# Patient Record
Sex: Male | Born: 1985
Health system: Southern US, Community
[De-identification: ages and names within clinical notes are randomized; demographics above are authoritative.]

## PROBLEM LIST (undated history)

## (undated) DIAGNOSIS — T7840XA Allergy, unspecified, initial encounter: Secondary | ICD-10-CM

## (undated) DIAGNOSIS — R55 Syncope and collapse: Secondary | ICD-10-CM

## (undated) DIAGNOSIS — Z8619 Personal history of other infectious and parasitic diseases: Secondary | ICD-10-CM

## (undated) HISTORY — DX: Allergy, unspecified, initial encounter: T78.40XA

## (undated) HISTORY — DX: Personal history of other infectious and parasitic diseases: Z86.19

## (undated) HISTORY — DX: Syncope and collapse: R55

---

## 2006-07-25 ENCOUNTER — Emergency Department (HOSPITAL_COMMUNITY): Admission: EM | Admit: 2006-07-25 | Discharge: 2006-07-25 | Payer: Self-pay | Admitting: Emergency Medicine

## 2007-04-03 ENCOUNTER — Emergency Department (HOSPITAL_COMMUNITY): Admission: EM | Admit: 2007-04-03 | Discharge: 2007-04-03 | Payer: Self-pay | Admitting: Emergency Medicine

## 2007-06-30 ENCOUNTER — Ambulatory Visit: Payer: Self-pay | Admitting: Internal Medicine

## 2007-06-30 DIAGNOSIS — L03319 Cellulitis of trunk, unspecified: Secondary | ICD-10-CM

## 2007-06-30 DIAGNOSIS — M1990 Unspecified osteoarthritis, unspecified site: Secondary | ICD-10-CM | POA: Insufficient documentation

## 2007-06-30 DIAGNOSIS — L02219 Cutaneous abscess of trunk, unspecified: Secondary | ICD-10-CM | POA: Insufficient documentation

## 2007-07-31 ENCOUNTER — Encounter: Payer: Self-pay | Admitting: Internal Medicine

## 2008-02-08 ENCOUNTER — Emergency Department (HOSPITAL_COMMUNITY): Admission: EM | Admit: 2008-02-08 | Discharge: 2008-02-08 | Payer: Self-pay | Admitting: Emergency Medicine

## 2012-12-04 ENCOUNTER — Ambulatory Visit: Payer: PRIVATE HEALTH INSURANCE

## 2012-12-04 ENCOUNTER — Ambulatory Visit (INDEPENDENT_AMBULATORY_CARE_PROVIDER_SITE_OTHER): Payer: PRIVATE HEALTH INSURANCE | Admitting: Emergency Medicine

## 2012-12-04 VITALS — BP 110/70 | HR 72 | Temp 98.1°F | Resp 16 | Ht 69.0 in | Wt 154.0 lb

## 2012-12-04 DIAGNOSIS — R059 Cough, unspecified: Secondary | ICD-10-CM

## 2012-12-04 DIAGNOSIS — J209 Acute bronchitis, unspecified: Secondary | ICD-10-CM

## 2012-12-04 DIAGNOSIS — R05 Cough: Secondary | ICD-10-CM

## 2012-12-04 LAB — POCT CBC
Granulocyte percent: 70.4 %G (ref 37–80)
HCT, POC: 49.1 % (ref 43.5–53.7)
Hemoglobin: 16.5 g/dL (ref 14.1–18.1)
MCH, POC: 32 pg — AB (ref 27–31.2)
MCV: 95.3 fL (ref 80–97)
Platelet Count, POC: 205 10*3/uL (ref 142–424)
RBC: 5.15 M/uL (ref 4.69–6.13)
WBC: 9.9 10*3/uL (ref 4.6–10.2)

## 2012-12-04 MED ORDER — AZITHROMYCIN 250 MG PO TABS: ORAL_TABLET | ORAL | Status: DC

## 2012-12-04 MED ORDER — BENZONATATE 100 MG PO CAPS: 100.0000 mg | ORAL_CAPSULE | Freq: Three times a day (TID) | ORAL | Status: DC | PRN

## 2012-12-04 NOTE — Progress Notes (Signed)
This chart was scribed for Earl Lites, MD by Joaquin Music, ED Scribe. This patient was seen in room Room 11 and the patient's care was started at 11:00 AM  Subjective:    Patient ID: Nicholas Hammond, male    DOB: Jan 25, 1986, 27 y.o.   MRN: [id number]  Sore Throat  Associated symptoms include coughing, headaches and shortness of breath.  Headache  Associated symptoms include coughing.  Cough Associated symptoms include chills, headaches and shortness of breath.   Nicholas Hammond is a 27 y.o. male who presents to the [facility] complaining of ongoing worsening chills, cough, SOB, sore throat, muscle aches, and possible fever onset 4 days. Pt denies taking temp at home. Pt states he has pain with deep breaths. Pt states his sputum has been green & yellow. 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 He states many of his co-workers are getting sick.  Pt requested a note to be excused from work due to current illness.   Review of Systems  Constitutional: Positive for chills.  Respiratory: Positive for cough and shortness of breath.   Neurological: Positive for headaches.       Objective:   Physical Exam  HENT:  Head: Normocephalic.  Right Ear: External ear normal.  Left Ear: External ear normal.  Mouth/Throat: Oropharynx is clear and moist.  Nose congested. Throat normal.  Cardiovascular: Normal rate, regular rhythm and normal heart sounds.   Pulmonary/Chest:  Rhonchi L anterior chest.  [facility] reading (PRIMARY) by  Dr. Cleta Alberts there are increased markings in the right middle lobe no consolidative infiltrate  Results for orders placed in visit on 12/04/12  POCT CBC      Result Value Range   WBC 9.9  4.6 - 10.2 K/uL   Lymph, poc 2.3  0.6 - 3.4   POC LYMPH PERCENT 23.0  10 - 50 %L   MID (cbc) 0.7  0 - 0.9   POC MID % 6.6  0 - 12 %M   POC Granulocyte 7.0 (*) 2 - 6.9   Granulocyte percent 70.4  37 - 80 %G   RBC 5.15  4.69 - 6.13 M/uL   Hemoglobin 16.5  14.1 - 18.1 g/dL   HCT,  POC 78.2  95.6 - 53.7 %   MCV 95.3  80 - 97 fL   MCH, POC 32.0 (*) 27 - 31.2 pg   MCHC 33.6  31.8 - 35.4 g/dL   RDW, POC 21.3     Platelet Count, POC 205  142 - 424 K/uL   MPV 10.1  0 - 99.8 fL       Assessment & Plan:  We'll treat with Z-Pak ,Tessalon Perles out of work note until Monday

## 2012-12-04 NOTE — Patient Instructions (Signed)
Smoking Cessation Quitting smoking is important to your health and has many advantages. However, it is not always easy to quit since nicotine is a very addictive drug. Often times, people try 3 times or more before being able to quit. This document explains the best ways for you to prepare to quit smoking. Quitting takes hard work and a lot of effort, but you can do it. ADVANTAGES OF QUITTING SMOKING  You will live longer, feel better, and live better.  Your body will feel the impact of quitting smoking almost immediately.  Within 20 minutes, blood pressure decreases. Your pulse returns to its normal level.  After 8 hours, carbon monoxide levels in the blood return to normal. Your oxygen level increases.  After 24 hours, the chance of having a heart attack starts to decrease. Your breath, hair, and body stop smelling like smoke.  After 48 hours, damaged nerve endings begin to recover. Your sense of taste and smell improve.  After 72 hours, the body is virtually free of nicotine. Your bronchial tubes relax and breathing becomes easier.  After 2 to 12 weeks, lungs can hold more air. Exercise becomes easier and circulation improves.  The risk of having a heart attack, stroke, cancer, or lung disease is greatly reduced.  After 1 year, the risk of coronary heart disease is cut in half.  After 5 years, the risk of stroke falls to the same as a nonsmoker.  After 10 years, the risk of lung cancer is cut in half and the risk of other cancers decreases significantly.  After 15 years, the risk of coronary heart disease drops, usually to the level of a nonsmoker.  If you are pregnant, quitting smoking will improve your chances of having a healthy baby.  The people you live with, especially any children, will be healthier.  You will have extra money to spend on things other than cigarettes. QUESTIONS TO THINK ABOUT BEFORE ATTEMPTING TO QUIT You may want to talk about your answers with your  caregiver.  Why do you want to quit?  If you tried to quit in the past, what helped and what did not?  What will be the most difficult situations for you after you quit? How will you plan to handle them?  Who can help you through the tough times? Your family? Friends? A caregiver?  What pleasures do you get from smoking? What ways can you still get pleasure if you quit? Here are some questions to ask your caregiver:  How can you help me to be successful at quitting?  What medicine do you think would be best for me and how should I take it?  What should I do if I need more help?  What is smoking withdrawal like? How can I get information on withdrawal? GET READY  Set a quit date.  Change your environment by getting rid of all cigarettes, ashtrays, matches, and lighters in your home, car, or work. Do not let people smoke in your home.  Review your past attempts to quit. Think about what worked and what did not. GET SUPPORT AND ENCOURAGEMENT You have a better chance of being successful if you have help. You can get support in many ways.  Tell your family, friends, and co-workers that you are going to quit and need their support. Ask them not to smoke around you.  Get individual, group, or telephone counseling and support. Programs are available at local hospitals and health centers. Call your local health department for   information about programs in your area.  Spiritual beliefs and practices may help some smokers quit.  Download a "quit meter" on your computer to keep track of quit statistics, such as how long you have gone without smoking, cigarettes not smoked, and money saved.  Get a self-help book about quitting smoking and staying off of tobacco. LEARN NEW SKILLS AND BEHAVIORS  Distract yourself from urges to smoke. Talk to someone, go for a walk, or occupy your time with a task.  Change your normal routine. Take a different route to work. Drink tea instead of coffee.  Eat breakfast in a different place.  Reduce your stress. Take a hot bath, exercise, or read a book.  Plan something enjoyable to do every day. Reward yourself for not smoking.  Explore interactive web-based programs that specialize in helping you quit. GET MEDICINE AND USE IT CORRECTLY Medicines can help you stop smoking and decrease the urge to smoke. Combining medicine with the above behavioral methods and support can greatly increase your chances of successfully quitting smoking.  Nicotine replacement therapy helps deliver nicotine to your body without the negative effects and risks of smoking. Nicotine replacement therapy includes nicotine gum, lozenges, inhalers, nasal sprays, and skin patches. Some may be available over-the-counter and others require a prescription.  Antidepressant medicine helps people abstain from smoking, but how this works is unknown. This medicine is available by prescription.  Nicotinic receptor partial agonist medicine simulates the effect of nicotine in your brain. This medicine is available by prescription. Ask your caregiver for advice about which medicines to use and how to use them based on your health history. Your caregiver will tell you what side effects to look out for if you choose to be on a medicine or therapy. Carefully read the information on the package. Do not use any other product containing nicotine while using a nicotine replacement product.  RELAPSE OR DIFFICULT SITUATIONS Most relapses occur within the first 3 months after quitting. Do not be discouraged if you start smoking again. Remember, most people try several times before finally quitting. You may have symptoms of withdrawal because your body is used to nicotine. You may crave cigarettes, be irritable, feel very hungry, cough often, get headaches, or have difficulty concentrating. The withdrawal symptoms are only temporary. They are strongest when you first quit, but they will go away within  10 14 days. To reduce the chances of relapse, try to:  Avoid drinking alcohol. Drinking lowers your chances of successfully quitting.  Reduce the amount of caffeine you consume. Once you quit smoking, the amount of caffeine in your body increases and can give you symptoms, such as a rapid heartbeat, sweating, and anxiety.  Avoid smokers because they can make you want to smoke.  Do not let weight gain distract you. Many smokers will gain weight when they quit, usually less than 10 pounds. Eat a healthy diet and stay active. You can always lose the weight gained after you quit.  Find ways to improve your mood other than smoking. FOR MORE INFORMATION  www.smokefree.gov  Document Released: 01/29/2001 Document Revised: 08/06/2011 Document Reviewed: 05/16/2011 Exit[employer] Patient Information 2014 [employer].  

## 2016-04-30 DIAGNOSIS — L2389 Allergic contact dermatitis due to other agents: Secondary | ICD-10-CM | POA: Diagnosis not present

## 2016-09-12 DIAGNOSIS — M25562 Pain in left knee: Secondary | ICD-10-CM | POA: Diagnosis not present

## 2016-09-20 DIAGNOSIS — M25562 Pain in left knee: Secondary | ICD-10-CM | POA: Diagnosis not present

## 2016-09-20 DIAGNOSIS — M238X2 Other internal derangements of left knee: Secondary | ICD-10-CM | POA: Diagnosis not present

## 2016-09-26 DIAGNOSIS — M25562 Pain in left knee: Secondary | ICD-10-CM | POA: Diagnosis not present

## 2016-10-04 DIAGNOSIS — M2392 Unspecified internal derangement of left knee: Secondary | ICD-10-CM | POA: Diagnosis not present

## 2016-10-04 DIAGNOSIS — M94262 Chondromalacia, left knee: Secondary | ICD-10-CM | POA: Diagnosis not present

## 2016-10-04 DIAGNOSIS — M25562 Pain in left knee: Secondary | ICD-10-CM | POA: Diagnosis not present

## 2017-04-01 ENCOUNTER — Ambulatory Visit (INDEPENDENT_AMBULATORY_CARE_PROVIDER_SITE_OTHER): Payer: 59 | Admitting: Family Medicine

## 2017-04-01 ENCOUNTER — Encounter: Payer: Self-pay | Admitting: Family Medicine

## 2017-04-01 VITALS — BP 110/80 | HR 73 | Temp 98.8°F | Ht 68.5 in | Wt 169.5 lb

## 2017-04-01 DIAGNOSIS — K529 Noninfective gastroenteritis and colitis, unspecified: Secondary | ICD-10-CM

## 2017-04-01 MED ORDER — DICYCLOMINE HCL 10 MG PO CAPS: 10.0000 mg | ORAL_CAPSULE | Freq: Three times a day (TID) | ORAL | 1 refills | Status: DC

## 2017-04-01 NOTE — Progress Notes (Signed)
Subjective:  Nicholas BoroughsJonathan K Hammond is a 32 y.o. male who presents today with a chief complaint of frequent stools and to establish care.   HPI:  Frequent Stools, new problem Symptoms started a couple weeks ago.  He was at a [employer] party where there were a lot of fluids late out of the table.  He is not sure if any of these foods are bad.  Over the last couple weeks, has had frequent stools.  Sometimes has as many as 6 bowel movements within 4 hours pain.  Bowel movements are usually well formed and solid.  No hematochezia.  No melena.  No watery or loose stools.  Occasionally has abdominal pain and a crampy sensation in his lower abdomen that resolves with bowel movements.  No nausea or vomiting.  No fevers or chills.  No night sweats.  No weight loss.  No recent medication changes.  No other obvious alleviating or aggravating factors.  ROS: Per HPI, otherwise a 10 point review of systems was performed and was negative  PMH:  The following were reviewed and entered/updated in epic: Past Medical History:  Diagnosis Date  . Allergy   . History of chicken pox    Patient Active Problem List   Diagnosis Date Noted  . Frequent stools 04/01/2017  . ABSCESS, TRUNK 06/30/2007  . DEGENERATIVE JOINT DISEASE, BACK 06/30/2007   History reviewed. No pertinent surgical history.  Family History  Problem Relation Age of Onset  . Crohn's disease Mother   . Arthritis Sister   . Heart disease Sister        takayasu's arteritis  . Rheum arthritis Sister   . Crohn's disease Sister   . Crohn's disease Brother   . Cancer Maternal Grandmother   . Heart disease Maternal Grandmother   . Heart disease Maternal Grandfather   . Heart disease Paternal Grandfather     Medications- reviewed and updated Current Outpatient Medications  Medication Sig Dispense Refill  . dicyclomine (BENTYL) 10 MG capsule Take 1 capsule (10 mg total) by mouth 4 (four) times daily -  before meals and at bedtime. 120  capsule 1   No current facility-administered medications for this visit.     Allergies-reviewed and updated Allergies  Allergen Reactions  . Influenza Vaccines Other (See Comments)    Seizures   Social History   Socioeconomic History  . Marital status: Single    Spouse name: None  . Number of children: None  . Years of education: None  . Highest education level: None  Social Needs  . Financial resource strain: None  . Food insecurity - worry: None  . Food insecurity - inability: None  . Transportation needs - medical: None  . Transportation needs - non-medical: None  Occupational History  . None  Tobacco Use  . Smoking status: Current Every Day Smoker    Packs/day: 1.00    Years: 15.00    Pack years: 15.00    Types: Cigarettes  . Smokeless tobacco: Never Used  . Tobacco comment: No longer smoking cigarettes / VAPE 100 ml's a month  Substance and Sexual Activity  . Alcohol use: Yes    Comment: socially  . Drug use: Yes    Types: Marijuana    Comment: 1/4 tsp everyday  . Sexual activity: Yes  Other Topics Concern  . None  Social History Narrative  . None     Objective:  Physical Exam: BP 110/80 (BP Location: Left Arm, Patient Position: Sitting,  Cuff Size: Normal)   Pulse 73   Temp 98.8 F (37.1 C) (Oral)   Ht 5' 8.5" (1.74 m)   Wt 169 lb 8 oz (76.9 kg)   SpO2 96%   BMI 25.40 kg/m   Gen: NAD, resting comfortably CV: RRR with no murmurs appreciated Pulm: NWOB, CTAB with no crackles, wheezes, or rhonchi GI: Normal bowel sounds present. Soft, Nontender, Nondistended. MSK: No edema, cyanosis, or clubbing noted Skin: Warm, dry Neuro: Grossly normal, moves all extremities Psych: Normal affect and thought content  Assessment/Plan:  Frequent stools Unclear etiology.  May be irritable bowel syndrome.  Also consider food borne illness, however less likely given his lack of diarrhea, nausea/vomiting, and abdominal pain.  Will check stool panel to rule out  infectious causes.  We will also empirically start Bentyl to help with spasms.  He does not have any red flag signs or symptoms.  His abdominal exam is benign.  If symptoms persist and do not improve with Bentyl, would consider referral to GI for further evaluation.  Katina Degree. Jimmey Ralph, MD 04/01/2017 4:50 PM

## 2017-04-01 NOTE — Patient Instructions (Signed)
Start the bentyl.  We will check a stool sample.  Come back to see me soon.  Take care, Dr Jimmey RalphParker

## 2017-04-01 NOTE — Assessment & Plan Note (Signed)
Unclear etiology.  May be irritable bowel syndrome.  Also consider food borne illness, however less likely given his lack of diarrhea, nausea/vomiting, and abdominal pain.  Will check stool panel to rule out infectious causes.  We will also empirically start Bentyl to help with spasms.  He does not have any red flag signs or symptoms.  His abdominal exam is benign.  If symptoms persist and do not improve with Bentyl, would consider referral to GI for further evaluation.

## 2017-04-04 ENCOUNTER — Ambulatory Visit (INDEPENDENT_AMBULATORY_CARE_PROVIDER_SITE_OTHER): Payer: 59 | Admitting: Family Medicine

## 2017-04-04 ENCOUNTER — Encounter: Payer: Self-pay | Admitting: Family Medicine

## 2017-04-04 VITALS — BP 118/72 | HR 78 | Temp 98.5°F | Ht 68.5 in | Wt 167.0 lb

## 2017-04-04 DIAGNOSIS — F17200 Nicotine dependence, unspecified, uncomplicated: Secondary | ICD-10-CM | POA: Diagnosis not present

## 2017-04-04 DIAGNOSIS — Z114 Encounter for screening for human immunodeficiency virus [HIV]: Secondary | ICD-10-CM

## 2017-04-04 DIAGNOSIS — Z13220 Encounter for screening for lipoid disorders: Secondary | ICD-10-CM

## 2017-04-04 DIAGNOSIS — K529 Noninfective gastroenteritis and colitis, unspecified: Secondary | ICD-10-CM

## 2017-04-04 DIAGNOSIS — Z0001 Encounter for general adult medical examination with abnormal findings: Secondary | ICD-10-CM

## 2017-04-04 LAB — LIPID PANEL
Cholesterol: 191 mg/dL (ref 0–200)
HDL: 45.4 mg/dL (ref >39.00)
LDL Cholesterol: 129 mg/dL — ABNORMAL HIGH (ref 0–99)
NONHDL: 145.68
Total CHOL/HDL Ratio: 4
Triglycerides: 82 mg/dL (ref 0.0–149.0)
VLDL: 16.4 mg/dL (ref 0.0–40.0)

## 2017-04-04 LAB — CBC
HEMATOCRIT: 47.2 % (ref 39.0–52.0)
Hemoglobin: 16.3 g/dL (ref 13.0–17.0)
MCHC: 34.6 g/dL (ref 30.0–36.0)
MCV: 89.6 fl (ref 78.0–100.0)
Platelets: 241 10*3/uL (ref 150.0–400.0)
RBC: 5.26 Mil/uL (ref 4.22–5.81)
RDW: 13.3 % (ref 11.5–15.5)
WBC: 5.9 10*3/uL (ref 4.0–10.5)

## 2017-04-04 LAB — COMPREHENSIVE METABOLIC PANEL
ALT: 15 U/L (ref 0–53)
AST: 17 U/L (ref 0–37)
Albumin: 5.2 g/dL (ref 3.5–5.2)
Alkaline Phosphatase: 65 U/L (ref 39–117)
BUN: 13 mg/dL (ref 6–23)
CHLORIDE: 101 meq/L (ref 96–112)
CO2: 31 mEq/L (ref 19–32)
Calcium: 10.1 mg/dL (ref 8.4–10.5)
Creatinine, Ser: 1.01 mg/dL (ref 0.40–1.50)
GFR: 90.39 mL/min (ref >60.00)
GLUCOSE: 89 mg/dL (ref 70–99)
POTASSIUM: 3.9 meq/L (ref 3.5–5.1)
SODIUM: 143 meq/L (ref 135–145)
Total Bilirubin: 1 mg/dL (ref 0.2–1.2)
Total Protein: 7.6 g/dL (ref 6.0–8.3)

## 2017-04-04 NOTE — Progress Notes (Signed)
Subjective:  Nicholas BoroughsJonathan K Hammond is a 32 y.o. male who presents today for his annual comprehensive physical exam.    HPI:  He has no acute complaints today.   Lifestyle Diet: Cut out a lot of sweets. No specific diets.  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   Depression screen PHQ 2/9 04/01/2017  Decreased Interest 0  Down, Depressed, Hopeless 0  PHQ - 2 Score 0   Health Maintenance Due  Topic Date Due  . HIV Screening  03/16/1999    ROS: Per HPI, otherwise a 10 point review of systems was performed and was negative  PMH:  The following were reviewed and entered/updated in epic: Past Medical History:  Diagnosis Date  . Allergy   . History of chicken pox    Patient Active Problem List   Diagnosis Date Noted  . Nicotine dependence with current use 04/04/2017  . Frequent stools 04/01/2017  . ABSCESS, TRUNK 06/30/2007  . DEGENERATIVE JOINT DISEASE, BACK 06/30/2007   History reviewed. No pertinent surgical history.  Family History  Problem Relation Age of Onset  . Crohn's disease Mother   . Arthritis Sister   . Heart disease Sister        takayasu's arteritis  . Rheum arthritis Sister   . Crohn's disease Sister   . Crohn's disease Brother   . Cancer Maternal Grandmother   . Heart disease Maternal Grandmother   . Heart disease Maternal Grandfather   . Heart disease Paternal Grandfather     Medications- reviewed and updated Current Outpatient Medications  Medication Sig Dispense Refill  . dicyclomine (BENTYL) 10 MG capsule Take 1 capsule (10 mg total) by mouth 4 (four) times daily -  before meals and at bedtime. (Patient not taking: Reported on 04/04/2017) 120 capsule 1   No current facility-administered medications for this visit.     Allergies-reviewed and updated Allergies  Allergen Reactions  . Influenza Vaccines Other (See Comments)    Seizures    Social History   Socioeconomic History  . Marital status: Single    Spouse name: None    . Number of children: None  . Years of education: None  . Highest education level: None  Social Needs  . Financial resource strain: None  . Food insecurity - worry: None  . Food insecurity - inability: None  . Transportation needs - medical: None  . Transportation needs - non-medical: None  Occupational History  . None  Tobacco Use  . Smoking status: Current Every Day Smoker    Packs/day: 1.00    Years: 15.00    Pack years: 15.00    Types: Cigarettes  . Smokeless tobacco: Never Used  . Tobacco comment: No longer smoking cigarettes / VAPE 100 ml's a month  Substance and Sexual Activity  . Alcohol use: Yes    Comment: socially  . Drug use: Yes    Types: Marijuana    Comment: 1/4 tsp everyday  . Sexual activity: Yes  Other Topics Concern  . None  Social History Narrative  . None    Objective:  Physical Exam: BP 118/72 (BP Location: Left Arm, Patient Position: Sitting, Cuff Size: Normal)   Pulse 78   Temp 98.5 F (36.9 C) (Oral)   Ht 5' 8.5" (1.74 m)   Wt 167 lb (75.8 kg)   SpO2 98%   BMI 25.02 kg/m   Body mass index is 25.02 kg/m. Wt Readings from Last 3 Encounters:  04/04/17 167 lb (75.8 kg)  04/01/17 169 lb 8 oz (76.9 kg)  12/04/12 154 lb (69.9 kg)   Gen: NAD, resting comfortably HEENT: TMs normal bilaterally. OP clear. No thyromegaly noted.  CV: RRR with no murmurs appreciated Pulm: NWOB, CTAB with no crackles, wheezes, or rhonchi GI: Normal bowel sounds present. Soft, Nontender, Nondistended. MSK: no edema, cyanosis, or clubbing noted Skin: warm, dry Neuro: CN2-12 grossly intact. Strength 5/5 in upper and lower extremities. Reflexes symmetric and intact bilaterally.  Psych: Normal affect and thought content  Assessment/Plan:  Nicotine dependence with current use Patient was asked about his nicotine use today and was strongly advised to quit. Patient is currently vaping and actively trying to cut bacl. We reviewed treatment options to assist him quit  smoking including NRT, Chantix, and Bupropion. Follow up at next office visit.   Total time spent counseling approximately 3 minutes.    Preventative Healthcare: Check lipid panel and HIV antibody.  Patient Counseling:  -Nutrition: Stressed importance of moderation in sodium/caffeine intake, saturated fat and cholesterol, caloric balance, sufficient intake of fresh fruits, vegetables, and fiber.  -Stressed the importance of regular exercise.   -Substance Abuse: Discussed cessation/primary prevention of tobacco, alcohol, or other drug use; driving or other dangerous activities under the influence; availability of treatment for abuse.   -Injury prevention: Discussed safety belts, safety helmets, smoke detector, smoking near bedding or upholstery.   -Sexuality: Discussed sexually transmitted diseases, partner selection, use of condoms, avoidance of unintended pregnancy and contraceptive alternatives.   -Dental health: Discussed importance of regular tooth brushing, flossing, and dental visits.  -Health maintenance and immunizations reviewed. Please refer to Health maintenance section.  Return to care in 1 year for next preventative visit.   Katina Degree. Jimmey Ralph, MD 04/04/2017 10:55 AM

## 2017-04-04 NOTE — Patient Instructions (Signed)
Preventive Care 18-39 Years, Male Preventive care refers to lifestyle choices and visits with your health care provider that can promote health and wellness. What does preventive care include?  A yearly physical exam. This is also called an annual well check.  Dental exams once or twice a year.  Routine eye exams. Ask your health care provider how often you should have your eyes checked.  Personal lifestyle choices, including: ? Daily care of your teeth and gums. ? Regular physical activity. ? Eating a healthy diet. ? Avoiding tobacco and drug use. ? Limiting alcohol use. ? Practicing safe sex. What happens during an annual well check? The services and screenings done by your health care provider during your annual well check will depend on your age, overall health, lifestyle risk factors, and family history of disease. Counseling Your health care provider may ask you questions about your:  Alcohol use.  Tobacco use.  Drug use.  Emotional well-being.  Home and relationship well-being.  Sexual activity.  Eating habits.  Work and work Statistician.  Screening You may have the following tests or measurements:  Height, weight, and BMI.  Blood pressure.  Lipid and cholesterol levels. These may be checked every 5 years starting at age 72.  Diabetes screening. This is done by checking your blood sugar (glucose) after you have not eaten for a while (fasting).  Skin check.  Hepatitis C blood test.  Hepatitis B blood test.  Sexually transmitted disease (STD) testing.  Discuss your test results, treatment options, and if necessary, the need for more tests with your health care provider. Vaccines Your health care provider may recommend certain vaccines, such as:  Influenza vaccine. This is recommended every year.  Tetanus, diphtheria, and acellular pertussis (Tdap, Td) vaccine. You may need a Td booster every 10 years.  Varicella vaccine. You may need this if you  have not been vaccinated.  HPV vaccine. If you are 77 or younger, you may need three doses over 6 months.  Measles, mumps, and rubella (MMR) vaccine. You may need at least one dose of MMR.You may also need a second dose.  Pneumococcal 13-valent conjugate (PCV13) vaccine. You may need this if you have certain conditions and have not been vaccinated.  Pneumococcal polysaccharide (PPSV23) vaccine. You may need one or two doses if you smoke cigarettes or if you have certain conditions.  Meningococcal vaccine. One dose is recommended if you are age 55-21 years and a first-year college student living in a residence hall, or if you have one of several medical conditions. You may also need additional booster doses.  Hepatitis A vaccine. You may need this if you have certain conditions or if you travel or work in places where you may be exposed to hepatitis A.  Hepatitis B vaccine. You may need this if you have certain conditions or if you travel or work in places where you may be exposed to hepatitis B.  Haemophilus influenzae type b (Hib) vaccine. You may need this if you have certain risk factors.  Talk to your health care provider about which screenings and vaccines you need and how often you need them. This information is not intended to replace advice given to you by your health care provider. Make sure you discuss any questions you have with your health care provider. Document Released: 04/02/2001 Document Revised: 10/25/2015 Document Reviewed: 12/06/2014 Elsevier Interactive Patient Education  [employer].

## 2017-04-04 NOTE — Assessment & Plan Note (Signed)
Patient was asked about his nicotine use today and was strongly advised to quit. Patient is currently vaping and actively trying to cut bacl. We reviewed treatment options to assist him quit smoking including NRT, Chantix, and Bupropion. Follow up at next office visit.   Total time spent counseling approximately 3 minutes.

## 2017-04-05 LAB — HIV ANTIBODY (ROUTINE TESTING W REFLEX): HIV 1&2 Ab, 4th Generation: NONREACTIVE

## 2017-04-07 LAB — GASTROINTESTINAL PATHOGEN PANEL PCR
C. DIFFICILE TOX A/B, PCR: NOT DETECTED
CRYPTOSPORIDIUM, PCR: NOT DETECTED
Campylobacter, PCR: NOT DETECTED
E COLI (STEC) STX1/STX2, PCR: NOT DETECTED
E COLI 0157, PCR: NOT DETECTED
E coli (ETEC) LT/ST PCR: NOT DETECTED
Giardia lamblia, PCR: NOT DETECTED
Norovirus, PCR: NOT DETECTED
ROTAVIRUS, PCR: NOT DETECTED
SALMONELLA, PCR: NOT DETECTED
Shigella, PCR: NOT DETECTED

## 2017-04-14 ENCOUNTER — Telehealth: Payer: Self-pay | Admitting: Family Medicine

## 2017-04-14 NOTE — Telephone Encounter (Signed)
Copied from CRM [id number]. Topic: Quick Communication - Lab Results >> Apr 08, 2017  2:53 PM Agner, Ricki MillerAmber M, CMA wrote: Called patient to inform them of lab results. When patient returns call, triage nurse may disclose results.   04/14/17   Pt returning call about lab work please call back at [phone]

## 2017-04-14 NOTE — Telephone Encounter (Signed)
Pt given results per notes of Dr. Jimmey RalphParker on 04/08/17, patient verbalized understanding. He says his stools are better and back to normal.  Unable to document in result note due to result note not being in PEC basket.

## 2017-04-15 NOTE — Telephone Encounter (Signed)
Noted  

## 2018-01-31 DIAGNOSIS — H60391 Other infective otitis externa, right ear: Secondary | ICD-10-CM | POA: Diagnosis not present

## 2018-01-31 DIAGNOSIS — J069 Acute upper respiratory infection, unspecified: Secondary | ICD-10-CM | POA: Diagnosis not present

## 2018-01-31 DIAGNOSIS — J00 Acute nasopharyngitis [common cold]: Secondary | ICD-10-CM | POA: Diagnosis not present

## 2019-03-05 DIAGNOSIS — R0782 Intercostal pain: Secondary | ICD-10-CM | POA: Diagnosis not present

## 2019-03-05 DIAGNOSIS — Z0001 Encounter for general adult medical examination with abnormal findings: Secondary | ICD-10-CM | POA: Diagnosis not present

## 2019-03-09 DIAGNOSIS — R0782 Intercostal pain: Secondary | ICD-10-CM | POA: Diagnosis not present

## 2019-03-09 DIAGNOSIS — Z0001 Encounter for general adult medical examination with abnormal findings: Secondary | ICD-10-CM | POA: Diagnosis not present

## 2019-05-27 ENCOUNTER — Ambulatory Visit: Payer: Self-pay

## 2019-05-27 ENCOUNTER — Ambulatory Visit: Payer: Self-pay | Attending: Internal Medicine

## 2019-05-27 DIAGNOSIS — Z23 Encounter for immunization: Secondary | ICD-10-CM

## 2019-05-27 NOTE — Progress Notes (Signed)
   Covid-19 Vaccination Clinic  Name:  [name]    MRN: [id number] DOB: [birth year]  05/27/2019  [name] was observed post Covid-19 immunization for 15 minutes without incident. He was provided with Vaccine Information Sheet and instruction to access the V-Safe system.   [name] was instructed to call 911 with any severe reactions post vaccine: Marland Kitchen Difficulty breathing  . Swelling of face and throat  . A fast heartbeat  . A bad rash all over body  . Dizziness and weakness   Immunizations Administered    Name Date Dose VIS Date Route   Moderna COVID-19 Vaccine 05/27/2019 10:06 AM 0.5 mL 01/19/2019 Intramuscular   Manufacturer: Moderna   Lot: [id number]   NDC: 36144-315-40

## 2019-06-29 ENCOUNTER — Ambulatory Visit: Payer: Self-pay | Attending: Family

## 2019-06-29 DIAGNOSIS — Z23 Encounter for immunization: Secondary | ICD-10-CM

## 2019-06-29 NOTE — Progress Notes (Signed)
   Covid-19 Vaccination Clinic  Name:  Nicholas Hammond    MRN: [id number] DOB: 1985/12/03  06/29/2019  [name] was observed post Covid-19 immunization for 15 minutes without incident. He was provided with Vaccine Information Sheet and instruction to access the V-Safe system.   [name] was instructed to call 911 with any severe reactions post vaccine: Marland Kitchen Difficulty breathing  . Swelling of face and throat  . A fast heartbeat  . A bad rash all over body  . Dizziness and weakness   Immunizations Administered    Name Date Dose VIS Date Route   Moderna COVID-19 Vaccine 06/29/2019 10:00 AM 0.5 mL 01/2019 Intramuscular   Manufacturer: Moderna   Lot: [id number]   NDC: 94446-190-12

## 2019-07-26 DIAGNOSIS — M545 Low back pain: Secondary | ICD-10-CM | POA: Diagnosis not present

## 2019-07-26 DIAGNOSIS — M461 Sacroiliitis, not elsewhere classified: Secondary | ICD-10-CM | POA: Diagnosis not present

## 2019-08-02 DIAGNOSIS — M545 Low back pain: Secondary | ICD-10-CM | POA: Diagnosis not present

## 2019-09-20 DIAGNOSIS — Z23 Encounter for immunization: Secondary | ICD-10-CM | POA: Diagnosis not present

## 2019-11-01 DIAGNOSIS — H60509 Unspecified acute noninfective otitis externa, unspecified ear: Secondary | ICD-10-CM | POA: Diagnosis not present

## 2019-12-15 DIAGNOSIS — M546 Pain in thoracic spine: Secondary | ICD-10-CM | POA: Diagnosis not present

## 2019-12-15 DIAGNOSIS — M545 Low back pain, unspecified: Secondary | ICD-10-CM | POA: Diagnosis not present

## 2020-04-19 DIAGNOSIS — K089 Disorder of teeth and supporting structures, unspecified: Secondary | ICD-10-CM | POA: Diagnosis not present

## 2020-04-19 DIAGNOSIS — K122 Cellulitis and abscess of mouth: Secondary | ICD-10-CM | POA: Diagnosis not present

## 2020-07-09 ENCOUNTER — Emergency Department (HOSPITAL_COMMUNITY): Payer: BC Managed Care – PPO

## 2020-07-09 ENCOUNTER — Other Ambulatory Visit: Payer: Self-pay

## 2020-07-09 ENCOUNTER — Emergency Department (HOSPITAL_COMMUNITY)
Admission: EM | Admit: 2020-07-09 | Discharge: 2020-07-10 | Disposition: A | Discharge status: Home / Self Care | Payer: BC Managed Care – PPO | Attending: Emergency Medicine | Admitting: Emergency Medicine

## 2020-07-09 ENCOUNTER — Encounter (HOSPITAL_COMMUNITY): Payer: Self-pay

## 2020-07-09 DIAGNOSIS — E876 Hypokalemia: Secondary | ICD-10-CM | POA: Diagnosis not present

## 2020-07-09 DIAGNOSIS — F17210 Nicotine dependence, cigarettes, uncomplicated: Secondary | ICD-10-CM | POA: Insufficient documentation

## 2020-07-09 DIAGNOSIS — R4020 Unspecified coma: Secondary | ICD-10-CM

## 2020-07-09 DIAGNOSIS — G40909 Epilepsy, unspecified, not intractable, without status epilepticus: Secondary | ICD-10-CM | POA: Insufficient documentation

## 2020-07-09 DIAGNOSIS — R079 Chest pain, unspecified: Secondary | ICD-10-CM | POA: Diagnosis not present

## 2020-07-09 DIAGNOSIS — R569 Unspecified convulsions: Secondary | ICD-10-CM

## 2020-07-09 DIAGNOSIS — R55 Syncope and collapse: Secondary | ICD-10-CM | POA: Insufficient documentation

## 2020-07-09 DIAGNOSIS — R0789 Other chest pain: Secondary | ICD-10-CM | POA: Diagnosis not present

## 2020-07-09 DIAGNOSIS — R001 Bradycardia, unspecified: Secondary | ICD-10-CM | POA: Diagnosis not present

## 2020-07-09 LAB — BASIC METABOLIC PANEL
Anion gap: 10 (ref 5–15)
BUN: 12 mg/dL (ref 6–20)
CO2: 28 mmol/L (ref 22–32)
Calcium: 9.1 mg/dL (ref 8.9–10.3)
Chloride: 103 mmol/L (ref 98–111)
Creatinine, Ser: 0.97 mg/dL (ref 0.61–1.24)
GFR, Estimated: >60 mL/min (ref >60)
Glucose, Bld: 113 mg/dL — ABNORMAL HIGH (ref 70–99)
Potassium: 2.9 mmol/L — ABNORMAL LOW (ref 3.5–5.1)
Sodium: 141 mmol/L (ref 135–145)

## 2020-07-09 LAB — CBC WITH DIFFERENTIAL/PLATELET
Abs Immature Granulocytes: 0.05 10*3/uL (ref 0.00–0.07)
Basophils Absolute: 0.1 10*3/uL (ref 0.0–0.1)
Basophils Relative: 1 %
Eosinophils Absolute: 0.2 10*3/uL (ref 0.0–0.5)
Eosinophils Relative: 2 %
HCT: 41.5 % (ref 39.0–52.0)
Hemoglobin: 14.6 g/dL (ref 13.0–17.0)
Immature Granulocytes: 0 %
Lymphocytes Relative: 40 %
Lymphs Abs: 5.6 10*3/uL — ABNORMAL HIGH (ref 0.7–4.0)
MCH: 31.2 pg (ref 26.0–34.0)
MCHC: 35.2 g/dL (ref 30.0–36.0)
MCV: 88.7 fL (ref 80.0–100.0)
Monocytes Absolute: 0.7 10*3/uL (ref 0.1–1.0)
Monocytes Relative: 5 %
Neutro Abs: 7.4 10*3/uL (ref 1.7–7.7)
Neutrophils Relative %: 52 %
Platelets: 311 10*3/uL (ref 150–400)
RBC: 4.68 MIL/uL (ref 4.22–5.81)
RDW: 12.8 % (ref 11.5–15.5)
WBC: 14.1 10*3/uL — ABNORMAL HIGH (ref 4.0–10.5)
nRBC: 0 % (ref 0.0–0.2)

## 2020-07-09 LAB — TROPONIN I (HIGH SENSITIVITY): Troponin I (High Sensitivity): 3 ng/L (ref <18)

## 2020-07-09 MED ORDER — SODIUM CHLORIDE 0.9 % IV BOLUS
1000.0000 mL | Freq: Once | INTRAVENOUS | Status: AC
  Administered 2020-07-10: 1000 mL via INTRAVENOUS

## 2020-07-09 MED ORDER — POTASSIUM CHLORIDE CRYS ER 20 MEQ PO TBCR
40.0000 meq | EXTENDED_RELEASE_TABLET | Freq: Once | ORAL | Status: AC
  Administered 2020-07-10: 40 meq via ORAL
  Filled 2020-07-09 (×2): qty 2

## 2020-07-09 NOTE — ED Provider Notes (Incomplete)
[facility] EMERGENCY DEPARTMENT Provider Note   CSN: [id number] Arrival date & time: 07/09/20  2115     History Chief Complaint  Patient presents with  . Near Syncope    Nicholas Hammond is a 35 y.o. male with a hx of tobacco use who presents to the ED   HPI     Past Medical History:  Diagnosis Date  . Allergy   . History of chicken pox     Patient Active Problem List   Diagnosis Date Noted  . Nicotine dependence with current use 04/04/2017  . Frequent stools 04/01/2017  . ABSCESS, TRUNK 06/30/2007  . DEGENERATIVE JOINT DISEASE, BACK 06/30/2007    History reviewed. No pertinent surgical history.     Family History  Problem Relation Age of Onset  . Crohn's disease Mother   . Arthritis Sister   . Heart disease Sister        takayasu's arteritis  . Rheum arthritis Sister   . Crohn's disease Sister   . Crohn's disease Brother   . Cancer Maternal Grandmother   . Heart disease Maternal Grandmother   . Heart disease Maternal Grandfather   . Heart disease Paternal Grandfather     Social History   Tobacco Use  . Smoking status: Current Every Day Smoker    Packs/day: 1.00    Years: 15.00    Pack years: 15.00    Types: Cigarettes  . Smokeless tobacco: Never Used  . Tobacco comment: No longer smoking cigarettes / VAPE 100 ml's a month  Vaping Use  . Vaping Use: Never used  Substance Use Topics  . Alcohol use: Yes    Comment: socially  . Drug use: Yes    Types: Marijuana    Comment: 1/4 tsp everyday    Home Medications Prior to Admission medications   Medication Sig Start Date End Date Taking? Authorizing Provider  dicyclomine (BENTYL) 10 MG capsule Take 1 capsule (10 mg total) by mouth 4 (four) times daily -  before meals and at bedtime. Patient not taking: Reported on 04/04/2017 04/01/17   Ardith Dark, MD    Allergies    Influenza vaccines  Review of Systems   Review of Systems  Physical Exam Updated Vital Signs BP  114/81   Pulse 79   Temp 97.7 F (36.5 C) (Oral)   Resp 16   Ht 5\' 8"  (1.727 m)   Wt 70.8 kg   SpO2 100%   BMI 23.72 kg/m   Physical Exam  ED Results / Procedures / Treatments   Labs (all labs ordered are listed, but only abnormal results are displayed) Labs Reviewed  CBC WITH DIFFERENTIAL/PLATELET - Abnormal; Notable for the following components:      Result Value   WBC 14.1 (*)    Lymphs Abs 5.6 (*)    All other components within normal limits  BASIC METABOLIC PANEL - Abnormal; Notable for the following components:   Potassium 2.9 (*)    Glucose, Bld 113 (*)    All other components within normal limits  URINALYSIS, ROUTINE W REFLEX MICROSCOPIC  RAPID URINE DRUG SCREEN, HOSP PERFORMED  TROPONIN I (HIGH SENSITIVITY)  TROPONIN I (HIGH SENSITIVITY)    EKG EKG Interpretation  Date/Time:  Sunday Jul 09 2020 21:25:46 EDT Ventricular Rate:  94 PR Interval:  210 QRS Duration: 94 QT Interval:  410 QTC Calculation: 512 R Axis:   92 Text Interpretation: Sinus rhythm with 1st degree A-V block Possible Left atrial enlargement  Rightward axis Incomplete right bundle branch block T wave abnormality, consider inferolateral ischemia Prolonged QT Abnormal ECG No significant change was found Confirmed by Dione Booze ([id number]) on 07/09/2020 11:21:50 PM   Radiology DG Chest 2 View  Result Date: 07/09/2020 CLINICAL DATA:  Chest pain.  Syncope.   diaphoretic and incontinent. EXAM: CHEST - 2 VIEW COMPARISON:  Chest x-ray 12/04/2012 FINDINGS: The heart size and mediastinal contours are within normal limits. No focal consolidation. No pulmonary edema. No pleural effusion. No pneumothorax. No acute osseous abnormality. IMPRESSION: No active cardiopulmonary disease. Electronically Signed   By: Tish Frederickson M.D.   On: 07/09/2020 22:34    Procedures Procedures {Remember to document critical care time when appropriate:1}  Medications Ordered in ED Medications - No data to display  ED  Course  I have reviewed the triage vital signs and the nursing notes.  Pertinent labs & imaging results that were available during my care of the patient were reviewed by me and considered in my medical decision making (see chart for details).    MDM Rules/Calculators/A&P                          *** Final Clinical Impression(s) / ED Diagnoses Final diagnoses:  None    Rx / DC Orders ED Discharge Orders    None

## 2020-07-09 NOTE — ED Triage Notes (Signed)
Brought in by [facility] - pt got up from couch and walked to the kitchen and then syncopal episode. Pt was pale, diaphoretic and incontinent.   Pt also resports  cp when it happened. Initial HR was 40s and went to 80s. G.18 left ac.   intial bp 100/70 and came up to 150/90.

## 2020-07-09 NOTE — ED Provider Notes (Signed)
Emergency Medicine Provider Triage Evaluation Note  Nicholas Hammond , a 35 y.o. male  was evaluated in triage.  Pt complains of syncopal episode. Patient states he turned quickly to throw a koozie on the counter and develop severe, sharp, central, non-radiating chest pain. Patient thought he pulled a muscle so started doing exercises and then he began getting acutely nauseous, diaphoretic, lightheaded, and short of breath. He then woke up on the floor. He had an episode of urinary incontinence. 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 Admits to marijuana use, but no other drugs. Vapes. His brother just had an MI who is in his 73s. No history of PE/DVT or recent long immobilizations. He just recently had all of his upper teeth extracted. No lower extremity edema.   Review of Systems  Positive: Chest pain. Syncope Negative: fever  Physical Exam  BP 114/81   Pulse 79   Temp 97.7 F (36.5 C) (Oral)   Resp 16   Ht 5\' 8"  (1.727 m)   Wt 70.8 kg   SpO2 100%   BMI 23.72 kg/m  Gen:   Awake, no distress   Resp:  Normal effort  MSK:   Moves extremities without difficulty  Other:    Medical Decision Making  Medically screening exam initiated at 9:32 PM.  Appropriate orders placed.  DORIN STOOKSBURY was informed that the remainder of the evaluation will be completed by another provider, this initial triage assessment does not replace that evaluation, and the importance of remaining in the ED until their evaluation is complete.  Labs ordered. No evidence of DVT on exam.    [name] 07/09/20 2138    2139, MD 07/10/20 2350

## 2020-07-10 ENCOUNTER — Emergency Department (HOSPITAL_COMMUNITY): Payer: BC Managed Care – PPO

## 2020-07-10 DIAGNOSIS — R569 Unspecified convulsions: Secondary | ICD-10-CM | POA: Diagnosis not present

## 2020-07-10 DIAGNOSIS — R0789 Other chest pain: Secondary | ICD-10-CM | POA: Diagnosis not present

## 2020-07-10 DIAGNOSIS — E876 Hypokalemia: Secondary | ICD-10-CM | POA: Diagnosis not present

## 2020-07-10 DIAGNOSIS — R55 Syncope and collapse: Secondary | ICD-10-CM | POA: Diagnosis not present

## 2020-07-10 LAB — URINALYSIS, ROUTINE W REFLEX MICROSCOPIC
Bilirubin Urine: NEGATIVE
Glucose, UA: NEGATIVE mg/dL
Hgb urine dipstick: NEGATIVE
Ketones, ur: NEGATIVE mg/dL
Leukocytes,Ua: NEGATIVE
Nitrite: NEGATIVE
Protein, ur: NEGATIVE mg/dL
Specific Gravity, Urine: 1.008 (ref 1.005–1.030)
pH: 6 (ref 5.0–8.0)

## 2020-07-10 LAB — RAPID URINE DRUG SCREEN, HOSP PERFORMED
Amphetamines: NOT DETECTED
Barbiturates: NOT DETECTED
Benzodiazepines: NOT DETECTED
Cocaine: NOT DETECTED
Opiates: NOT DETECTED
Tetrahydrocannabinol: POSITIVE — AB

## 2020-07-10 LAB — D-DIMER, QUANTITATIVE: D-Dimer, Quant: <0.27 ug/mL-FEU (ref 0.00–0.50)

## 2020-07-10 LAB — TROPONIN I (HIGH SENSITIVITY): Troponin I (High Sensitivity): 2 ng/L (ref <18)

## 2020-07-10 LAB — MAGNESIUM: Magnesium: 2.1 mg/dL (ref 1.7–2.4)

## 2020-07-10 MED ORDER — LIDOCAINE 5 % EX PTCH
1.0000 | MEDICATED_PATCH | CUTANEOUS | Status: DC
  Administered 2020-07-10: 1 via TRANSDERMAL
  Filled 2020-07-10: qty 1

## 2020-07-10 MED ORDER — LIDOCAINE 5 % EX PTCH: 1.0000 | MEDICATED_PATCH | Freq: Every day | CUTANEOUS | 0 refills | Status: DC | PRN

## 2020-07-10 MED ORDER — POTASSIUM CHLORIDE 10 MEQ/100ML IV SOLN
10.0000 meq | Freq: Once | INTRAVENOUS | Status: AC
  Administered 2020-07-10: 10 meq via INTRAVENOUS
  Filled 2020-07-10: qty 100

## 2020-07-10 MED ORDER — POTASSIUM CHLORIDE CRYS ER 20 MEQ PO TBCR: 20.0000 meq | EXTENDED_RELEASE_TABLET | Freq: Every day | ORAL | 0 refills | Status: DC

## 2020-07-10 MED ORDER — LORAZEPAM 2 MG/ML IJ SOLN
0.5000 mg | Freq: Once | INTRAMUSCULAR | Status: AC
  Administered 2020-07-10: 0.5 mg via INTRAVENOUS
  Filled 2020-07-10: qty 1

## 2020-07-10 NOTE — ED Provider Notes (Signed)
[facility] EMERGENCY DEPARTMENT Provider Note   CSN: [id number] Arrival date & time: 07/09/20  2115     History Chief Complaint  Patient presents with  . Near Syncope    Nicholas Hammond is a 35 y.o. male with a hx of tobacco use who presents to the ED with complaints of syncope vs. Seizure that occurred shortly PTA. Patient states he turned quickly to throw a coozie onto the counter with quick onset pain to the right chest like he pulled a muscle. He was trying to stretch/move to help with this and sat back down. When he got up again and was walking into the kitchen he got lightheaded, nauseated, lowered his head between his knees and lost consciousness. His wife states she found him on the ground on his back, she thought he was having a seizure as his eyes were fluttering and he had some jerking muscle movements throughout, but not persistent clonic activity. He had an episode of urinary incontinence with this, did not bite his tongue. He was a little confused when he woke up but this passed fairly quickly. Had a headache. Currently feels nauseous and his chest feels sore. Chest discomfort is worse with certain movements/positions.  Denies fever, cough, dyspnea, numbness, weakness, visual disturbance, vomiting, or abdominal pain. He has a childhood hx of seizures from age [age]- was on phenobarbital, thinks it was vaccine related.   HPI     Past Medical History:  Diagnosis Date  . Allergy   . History of chicken pox     Patient Active Problem List   Diagnosis Date Noted  . Nicotine dependence with current use 04/04/2017  . Frequent stools 04/01/2017  . ABSCESS, TRUNK 06/30/2007  . DEGENERATIVE JOINT DISEASE, BACK 06/30/2007    History reviewed. No pertinent surgical history.     Family History  Problem Relation Age of Onset  . Crohn's disease Mother   . Arthritis Sister   . Heart disease Sister        takayasu's arteritis  . Rheum arthritis Sister   .  Crohn's disease Sister   . Crohn's disease Brother   . Cancer Maternal Grandmother   . Heart disease Maternal Grandmother   . Heart disease Maternal Grandfather   . Heart disease Paternal Grandfather     Social History   Tobacco Use  . Smoking status: Current Every Day Smoker    Packs/day: 1.00    Years: 15.00    Pack years: 15.00    Types: Cigarettes  . Smokeless tobacco: Never Used  . Tobacco comment: No longer smoking cigarettes / VAPE 100 ml's a month  Vaping Use  . Vaping Use: Never used  Substance Use Topics  . Alcohol use: Yes    Comment: socially  . Drug use: Yes    Types: Marijuana    Comment: 1/4 tsp everyday    Home Medications Prior to Admission medications   Medication Sig Start Date End Date Taking? Authorizing Provider  dicyclomine (BENTYL) 10 MG capsule Take 1 capsule (10 mg total) by mouth 4 (four) times daily -  before meals and at bedtime. Patient not taking: Reported on 04/04/2017 04/01/17   Ardith Dark, MD    Allergies    Influenza vaccines  Review of Systems   Review of Systems  Constitutional: Negative for chills and fever.  Eyes: Negative for visual disturbance.  Respiratory: Negative for shortness of breath.   Cardiovascular: Positive for chest pain.  Gastrointestinal: Positive for  nausea. Negative for abdominal pain, blood in stool, constipation, diarrhea and vomiting.  Musculoskeletal: Negative for back pain.  Neurological: Positive for light-headedness. Negative for speech difficulty, weakness and numbness.       Positive for syncope vs seizure.   All other systems reviewed and are negative.   Physical Exam Updated Vital Signs BP 123/66   Pulse 91   Temp 97.7 F (36.5 C) (Oral)   Resp 12   Ht 5\' 8"  (1.727 m)   Wt 70.8 kg   SpO2 95%   BMI 23.72 kg/m   Physical Exam Vitals and nursing note reviewed.  Constitutional:      General: He is not in acute distress.    Appearance: He is well-developed. He is not toxic-appearing.   HENT:     Head: Normocephalic and atraumatic. No raccoon eyes or Battle's sign.     Mouth/Throat:     Comments: Uvula midline. No obvious intra-oral injury/tongue abrasion.  Eyes:     General:        Right eye: No discharge.        Left eye: No discharge.     Extraocular Movements: Extraocular movements intact.     Conjunctiva/sclera: Conjunctivae normal.     Pupils: Pupils are equal, round, and reactive to light.  Cardiovascular:     Rate and Rhythm: Normal rate and regular rhythm.     Pulses:          Radial pulses are 2+ on the right side and 2+ on the left side.  Pulmonary:     Effort: Pulmonary effort is normal. No respiratory distress.     Breath sounds: Normal breath sounds. No wheezing, rhonchi or rales.  Chest:     Chest wall: No tenderness.  Abdominal:     General: There is no distension.     Palpations: Abdomen is soft.     Tenderness: There is no abdominal tenderness. There is no guarding or rebound.  Musculoskeletal:     Cervical back: Normal range of motion and neck supple. No spinous process tenderness.     Comments: Upper/lower extremities: Actively ranging at all major joints. No focal bony tenderness.  Back: No midline tenderness.   Skin:    General: Skin is warm and dry.     Findings: No rash.  Neurological:     Mental Status: He is alert.     Comments: Clear speech. CN III-XII Grossly intact. Sensation grossly intact x 4. 5/5 symmetric grip strength & strength with plantar/dorsiflexion bilaterally. Negative pronator drift. Ambulatory. Normal finger to nose.   Psychiatric:        Behavior: Behavior normal.     ED Results / Procedures / Treatments   Labs (all labs ordered are listed, but only abnormal results are displayed) Labs Reviewed  CBC WITH DIFFERENTIAL/PLATELET - Abnormal; Notable for the following components:      Result Value   WBC 14.1 (*)    Lymphs Abs 5.6 (*)    All other components within normal limits  BASIC METABOLIC PANEL -  Abnormal; Notable for the following components:   Potassium 2.9 (*)    Glucose, Bld 113 (*)    All other components within normal limits  RAPID URINE DRUG SCREEN, HOSP PERFORMED - Abnormal; Notable for the following components:   Tetrahydrocannabinol POSITIVE (*)    All other components within normal limits  URINALYSIS, ROUTINE W REFLEX MICROSCOPIC  D-DIMER, QUANTITATIVE  MAGNESIUM  TROPONIN I (HIGH SENSITIVITY)  TROPONIN I (HIGH  SENSITIVITY)    EKG EKG Interpretation  Date/Time:  Sunday Jul 09 2020 21:25:46 EDT Ventricular Rate:  94 PR Interval:  210 QRS Duration: 94 QT Interval:  410 QTC Calculation: 512 R Axis:   92 Text Interpretation: Sinus rhythm with 1st degree A-V block Possible Left atrial enlargement Rightward axis Incomplete right bundle branch block T wave abnormality, consider inferolateral ischemia Prolonged QT Abnormal ECG No significant change was found Confirmed by Dione BoozeGlick, David ([id number]) on 07/09/2020 11:21:50 PM   Radiology DG Chest 2 View  Result Date: 07/09/2020 CLINICAL DATA:  Chest pain.  Syncope.   diaphoretic and incontinent. EXAM: CHEST - 2 VIEW COMPARISON:  Chest x-ray 12/04/2012 FINDINGS: The heart size and mediastinal contours are within normal limits. No focal consolidation. No pulmonary edema. No pleural effusion. No pneumothorax. No acute osseous abnormality. IMPRESSION: No active cardiopulmonary disease. Electronically Signed   By: Tish FredericksonMorgane  Naveau M.D.   On: 07/09/2020 22:34   CT Head Wo Contrast  Result Date: 07/10/2020 CLINICAL DATA:  Seizure EXAM: CT HEAD WITHOUT CONTRAST TECHNIQUE: Contiguous axial images were obtained from the base of the skull through the vertex without intravenous contrast. COMPARISON:  None. FINDINGS: Brain: There is no mass, hemorrhage or extra-axial collection. The size and configuration of the ventricles and extra-axial CSF spaces are normal. The brain parenchyma is normal, without acute or chronic infarction. Vascular: No  abnormal hyperdensity of the major intracranial arteries or dural venous sinuses. No intracranial atherosclerosis. Skull: The visualized skull base, calvarium and extracranial soft tissues are normal. Sinuses/Orbits: No fluid levels or advanced mucosal thickening of the visualized paranasal sinuses. No mastoid or middle ear effusion. The orbits are normal. IMPRESSION: Normal head CT. Electronically Signed   By: Deatra RobinsonKevin  Herman M.D.   On: 07/10/2020 01:58    Procedures Procedures   Medications Ordered in ED Medications  potassium chloride SA (KLOR-CON) CR tablet 40 mEq (0 mEq Oral Hold 07/10/20 0025)  sodium chloride 0.9 % bolus 1,000 mL (0 mLs Intravenous Stopped 07/10/20 0141)  LORazepam (ATIVAN) injection 0.5 mg (0.5 mg Intravenous Given 07/10/20 0027)  potassium chloride 10 mEq in 100 mL IVPB (0 mEq Intravenous Stopped 07/10/20 0141)    ED Course  I have reviewed the triage vital signs and the nursing notes.  Pertinent labs & imaging results that were available during my care of the patient were reviewed by me and considered in my medical decision making (see chart for details).    MDM Rules/Calculators/A&P                         Patient presents to the ED with complaints of seizure vs. Syncope and chest pain.  Nontoxic, vitals WNL. Ativan ordered for nausea, fluids for hydration.   Additional history obtained:  Additional history obtained from chart review, discussion with patient's wife, & nursing note review.   EKG: QTc is prolonged, overall unchanged from prior.   Lab Tests:  I Ordered, reviewed, and interpreted labs, which included:  CBC: leukocytosis felt to be nonspecific.  BMP: hypokalemia @ 2.9.  Mag: WNL Troponins: no significant elevation, flat Ddimer: WNL UA: Unremarkable.  UDS: + for tetrahydrocannabinol.    Imaging Studies ordered:  I ordered imaging studies which included CXR & Head CT, I independently reviewed, formal radiology impression shows:  CXR: No active  cardiopulmonary disease Head CT: Normal head CT.  ED Course:  In regards to patient's chest pain- overall reassuring work-up. No significant change compared to prior  ECGs, troponins are flat, I have a low suspicion for ACS, given strong family hx feel he would benefit from cardiology follow up. Low risk wells, d-dimer WNL, low suspicion for PE. Normotensive, symmetric pulses, no widened mediastinum on CXR- doubt dissection. CXR w/o pneumonia, pneumothorax, or rib fx. Likely muscular with onset when throwing something with the ipsilateral hand, and worse with certain movements- will give lidoderm patches. Overall unclear seizure vs. Syncope, mild hypokalemia orally/parenterally replaced, no other significant electrolyte derangement, heat ct reassuring. Discussed no driving/operating heavy machinery, will need to follow up with neurology.   Tolerating PO, remains without focal neuro deficits in the ED, feeling improved, appears appropriate for discharge. I discussed results, treatment plan, need for follow-up, and return precautions with the patient and his wife. Provided opportunity for questions, patient & his wife confirmed understanding and are in agreement with plan.   Findings and plan of care discussed with supervising physician Dr. Blinda Leatherwood who is in agreement.   Portions of this note were generated with Scientist, clinical (histocompatibility and immunogenetics). Dictation errors may occur despite best attempts at proofreading.  Final Clinical Impression(s) / ED Diagnoses Final diagnoses:  Loss of consciousness (HCC)  Seizure-like activity (HCC)  Chest pain, unspecified type  Hypokalemia    Rx / DC Orders ED Discharge Orders         Ordered    lidocaine (LIDODERM) 5 %  Daily PRN        07/10/20 0320    potassium chloride SA (KLOR-CON) 20 MEQ tablet  Daily        07/10/20 0320    Ambulatory referral to Neurology       Comments: An appointment is requested in approximately: 1 week   07/10/20 0323            Cherly Anderson, PA-C 07/10/20 0428    Gilda Crease, MD 07/11/20 [phone]

## 2020-07-10 NOTE — ED Notes (Signed)
E-signature pad unavailable at time of pt discharge. This RN discussed discharge materials with pt and answered all pt questions. Pt stated understanding of discharge material. ? ?

## 2020-07-10 NOTE — Discharge Instructions (Addendum)
You were seen in the ER today for chest pain and passing out vs. A seizure.   Your labs showed that your potassium is low- we replaced this in the ER and are sending you home with diet recommendations and a short course of a supplement.   We are sending you home with a lidoderm patch, apply 1 patch to your chest in area of pain once per day, remove and discard patch within 12 hours of application.   We have prescribed you new medication(s) today. Discuss the medications prescribed today with your pharmacist as they can have adverse effects and interactions with your other medicines including over the counter and prescribed medications. Seek medical evaluation if you start to experience new or abnormal symptoms after taking one of these medicines, seek care immediately if you start to experience difficulty breathing, feeling of your throat closing, facial swelling, or rash as these could be indications of a more serious allergic reaction  Given the possibility that you had a seizure DO NOT DRIVE OR OPERATE HEAVY MACHINERY until cleared by a neurologist.  Please follow up with [facility] and your primary care provider as soon as possible. We are also providing information to follow up with cardiology given your family history.   Return to the ER for new or worsening symptoms including but not limited to new or worsening pain, trouble breathing, inability to keep fluids down, fever, re-occurrence of passingout/seizure, or any other concerns.

## 2020-07-21 ENCOUNTER — Encounter: Payer: Self-pay | Admitting: Family Medicine

## 2020-07-21 ENCOUNTER — Ambulatory Visit: Payer: BC Managed Care – PPO | Admitting: Family Medicine

## 2020-07-21 ENCOUNTER — Other Ambulatory Visit: Payer: Self-pay

## 2020-07-21 VITALS — BP 122/88 | HR 85 | Temp 97.4°F | Ht 68.0 in | Wt 155.2 lb

## 2020-07-21 DIAGNOSIS — E876 Hypokalemia: Secondary | ICD-10-CM | POA: Diagnosis not present

## 2020-07-21 DIAGNOSIS — R55 Syncope and collapse: Secondary | ICD-10-CM

## 2020-07-21 NOTE — Progress Notes (Signed)
   Nicholas Hammond is a 35 y.o. male who presents today for an office visit.  He has not been seen in this office for over 3 years.   Assessment/Plan:  Loss of consciousness Thankfully has not had any recurrence of symptoms.  Likely due to dehydration and lack of adequate nutrition for the few weeks prior to his episode. His cardiovascular exam today is normal.  Had extensive work-up in the ED with which negative.  He is EKG did show some concern for AV block and atrial enlargement.  We will check an echocardiogram to further evaluate.  He has an upcoming appointment with cardiology in a couple of months.  Discussed reasons to return to care and seek emergent care.  Chest wall pain Symptoms are atypical.  Reassuring that they are nonexertional.  Likely secondary to muscular strain/inflammation related to his recent injury.  He will try taking some Flexeril to see if this helps.  Can also use over-the-counter meds as needed.  Discussed reasons to return to care.  Hypokalemia Recheck BMET today.  He has been eating foods high in potassium.    Subjective:  HPI:  Patient presented to the ED on 07/09/2020 with loss of consciousness.  He was in usual state of health quick and sudden onset of pain in the right side of his chest.  Sat down and then got up again.  Felt lightheaded, nauseated and then fell to the ground.  He lost consciousness during this episode.  This was witnessed.  Wife thought he was having a seizure due to some jerking movements.  Had extensive work-up in the lab including CT head scan and labs.  Found to have low potassium.  Rest of his work-up was essentially negative.  He was given IV potassium replacement and discharged home.  Since being home he has noticed intermittent chest pain over the last couple weeks.  Located in different parts of his chest.  Worse with certain motions.  He was able to hike recently without any exacerbation of symptoms.  Symptoms overall seem to be  improving modestly.  No recurrent syncopal episodes.  He does note that prior to syncopal episode he had went about 2 weeks only eating scrambled eggs due to some dental issues.       Objective:  Physical Exam: BP 122/88   Pulse 85   Temp (!) 97.4 F (36.3 C)   Ht 5\' 8"  (1.727 m)   Wt 155 lb 3.2 oz (70.4 kg)   SpO2 97%   BMI 23.60 kg/m   Gen: No acute distress, resting comfortably CV: Regular rate and rhythm with no murmurs appreciated Pulm: Normal work of breathing, clear to auscultation bilaterally with no crackles, wheezes, or rhonchi Neuro: Cranial nerves II through XII intact.  Finger-nose-finger testing intact bilaterally.  Strength out of 5 in upper and lower extremities.  Sensation to light touch intact throughout. Psych: Normal affect and thought content  Time Spent: 50 minutes of total time was spent on the date of the encounter performing the following actions: chart review prior to seeing the patient including recent ED visit, obtaining history, performing a medically necessary exam, counseling on the treatment plan, placing orders, and documenting in our EHR.        . Katina Degree, MD 07/21/2020 2:32 PM

## 2020-07-21 NOTE — Addendum Note (Signed)
Addended by: Cleda Mccreedy F on: 07/21/2020 02:36 PM   Modules accepted: Orders

## 2020-07-21 NOTE — Addendum Note (Signed)
Addended by: Verita Kuroda F on: 07/21/2020 02:36 PM   Modules accepted: Orders  

## 2020-07-21 NOTE — Patient Instructions (Signed)
It was very nice to see you today!  I think your episode was likely due to dehydration and lack of nutrition.  We will recheck your potassium today.  We will check an echocardiogram to make sure things okay with your heart.  Take care, Dr Jimmey Ralph  PLEASE NOTE:  If you had any lab tests please let us know if you have not heard back within a few days. You may see your results on mychart before we have a chance to review them but we will give you a call once they are reviewed by [place]. If we ordered any referrals today, please let us know if you have not heard from their office within the next week.   Please try these tips to maintain a healthy lifestyle:   Eat at least 3 REAL meals and 1-2 snacks per day.  Aim for no more than 5 hours between eating.  If you eat breakfast, please do so within one hour of getting up.    Each meal should contain half fruits/vegetables, one quarter protein, and one quarter carbs (no bigger than a computer mouse)   Cut down on sweet beverages. This includes juice, soda, and sweet tea.     Drink at least 1 glass of water with each meal and aim for at least 8 glasses per day   Exercise at least 150 minutes every week.

## 2020-07-24 ENCOUNTER — Encounter: Payer: Self-pay | Admitting: Family Medicine

## 2020-07-24 LAB — BASIC METABOLIC PANEL
BUN: 10 mg/dL (ref 6–23)
CO2: 28 mEq/L (ref 19–32)
Calcium: 10.3 mg/dL (ref 8.4–10.5)
Chloride: 100 mEq/L (ref 96–112)
Creatinine, Ser: 0.94 mg/dL (ref 0.40–1.50)
GFR: 104.4 mL/min (ref >60.00)
Glucose, Bld: 93 mg/dL (ref 70–99)
Potassium: 3.9 mEq/L (ref 3.5–5.1)
Sodium: 141 mEq/L (ref 135–145)

## 2020-07-24 NOTE — Progress Notes (Signed)
Please inform patient of the following:  Potassium levels back to normal. Do not need to do any further blood work at this time.  Nicholas Hammond. Jimmey Ralph, MD 07/24/2020 2:18 PM

## 2020-07-26 DIAGNOSIS — R55 Syncope and collapse: Secondary | ICD-10-CM | POA: Diagnosis not present

## 2020-07-26 DIAGNOSIS — R0789 Other chest pain: Secondary | ICD-10-CM | POA: Diagnosis not present

## 2020-07-26 DIAGNOSIS — R079 Chest pain, unspecified: Secondary | ICD-10-CM | POA: Diagnosis not present

## 2020-07-27 ENCOUNTER — Telehealth: Payer: Self-pay

## 2020-07-27 NOTE — Telephone Encounter (Signed)
LVM asking patient to call back to get scheduled. 

## 2020-07-27 NOTE — Telephone Encounter (Signed)
Patient is calling in asking for an update for the echocardiogram, hasnt heard about scheduling anything.

## 2020-07-27 NOTE — Telephone Encounter (Signed)
Nurse Assessment Nurse: Noe Gens, RN, Archie Patten Date/Time (Eastern Time): 07/26/2020 7:55:53 PM Confirm and document reason for call. If symptomatic, describe symptoms. ---Caller states he was bending over to turn on his gaming system and started feeling chest pain and tightness. He was tingling all over his body. Denies any other issues at this time. Does the patient have any new or worsening symptoms? ---Yes Will a triage be completed? ---Yes Related visit to physician within the last 2 weeks? ---Yes Does the PT have any chronic conditions? (i.e. diabetes, asthma, this includes High risk factors for pregnancy, etc.) ---No Is this a behavioral health or substance abuse call? ---No Guidelines Guideline Title Affirmed Question Affirmed Notes Nurse Date/Time (Eastern Time) Chest Pain [1] Chest pain lasts > 5 minutes AND [2] described as crushing, pressure-like, or heavy Tomasa Rand 07/26/2020 7:58:40 PM PLEASE NOTE: All timestamps contained within this report are represented as [place] Standard Time. CONFIDENTIALTY NOTICE: This fax transmission is intended only for the addressee. It contains information that is legally privileged, confidential or otherwise protected from use or disclosure. If you are not the intended recipient, you are strictly prohibited from reviewing, disclosing, copying using or disseminating any of this information or taking any action in reliance on or regarding this information. If you have received this fax in error, please notify us immediately by telephone so that we can arrange for its return to [place]. Phone: [phone], Toll-Free: [phone], Fax: [phone] Page: 2 of 2 Call Id: [id number] Disp. Time Lamount Cohen Time) Disposition Final User 07/26/2020 7:54:48 PM Send to Urgent Alena Bills 07/26/2020 8:07:46 PM 911 Outcome Documentation Noe Gens, RN, Archie Patten Reason: Caller reports chest pain >1minutes that felt like something heavy was on his chest.  Caller states he was able to reach EMS and can hear them arriving now 07/26/2020 8:01:28 PM Call EMS 911 Now Yes Noe Gens, RN, Archie Patten Caller Disagree/Comply Comply Caller Understands Yes PreDisposition InappropriateToAsk Care Advice Given Per Guideline CALL EMS 911 NOW: * Immediate medical attention is needed. You need to hang up and call 911 (or an ambulance). * Triager Discretion: I'll call you back in a few minutes to be sure you were able to reach them. CARE ADVICE given per Chest Pain (Adult) guideline

## 2020-07-27 NOTE — Telephone Encounter (Signed)
Please schedule appointment with the pt. ? ?Thanks!  ?

## 2020-07-28 ENCOUNTER — Ambulatory Visit: Payer: BC Managed Care – PPO | Admitting: Registered Nurse

## 2020-07-28 ENCOUNTER — Ambulatory Visit (HOSPITAL_BASED_OUTPATIENT_CLINIC_OR_DEPARTMENT_OTHER)
Admission: RE | Admit: 2020-07-28 | Discharge: 2020-07-28 | Disposition: A | Discharge status: Home / Self Care | Payer: BC Managed Care – PPO | Source: Ambulatory Visit | Attending: Registered Nurse | Admitting: Registered Nurse

## 2020-07-28 ENCOUNTER — Encounter: Payer: Self-pay | Admitting: Registered Nurse

## 2020-07-28 ENCOUNTER — Other Ambulatory Visit: Payer: Self-pay

## 2020-07-28 ENCOUNTER — Telehealth: Payer: Self-pay | Admitting: Family Medicine

## 2020-07-28 VITALS — BP 119/93 | HR 118 | Temp 98.0°F | Resp 18 | Ht 68.0 in | Wt 158.4 lb

## 2020-07-28 DIAGNOSIS — D72829 Elevated white blood cell count, unspecified: Secondary | ICD-10-CM | POA: Diagnosis not present

## 2020-07-28 DIAGNOSIS — M542 Cervicalgia: Secondary | ICD-10-CM | POA: Diagnosis not present

## 2020-07-28 DIAGNOSIS — Z8261 Family history of arthritis: Secondary | ICD-10-CM | POA: Diagnosis not present

## 2020-07-28 DIAGNOSIS — R079 Chest pain, unspecified: Secondary | ICD-10-CM

## 2020-07-28 LAB — CBC WITH DIFFERENTIAL/PLATELET
Basophils Absolute: 0.1 10*3/uL (ref 0.0–0.1)
Basophils Relative: 1.1 % (ref 0.0–3.0)
Eosinophils Absolute: 0.1 10*3/uL (ref 0.0–0.7)
Eosinophils Relative: 0.6 % (ref 0.0–5.0)
HCT: 46.5 % (ref 39.0–52.0)
Hemoglobin: 16 g/dL (ref 13.0–17.0)
Lymphocytes Relative: 27 % (ref 12.0–46.0)
Lymphs Abs: 2.1 10*3/uL (ref 0.7–4.0)
MCHC: 34.3 g/dL (ref 30.0–36.0)
MCV: 89.6 fl (ref 78.0–100.0)
Monocytes Absolute: 0.5 10*3/uL (ref 0.1–1.0)
Monocytes Relative: 6 % (ref 3.0–12.0)
Neutro Abs: 5.2 10*3/uL (ref 1.4–7.7)
Neutrophils Relative %: 65.3 % (ref 43.0–77.0)
Platelets: 227 10*3/uL (ref 150.0–400.0)
RBC: 5.19 Mil/uL (ref 4.22–5.81)
RDW: 13.6 % (ref 11.5–15.5)
WBC: 7.9 10*3/uL (ref 4.0–10.5)

## 2020-07-28 LAB — COMPREHENSIVE METABOLIC PANEL
ALT: 24 U/L (ref 0–53)
AST: 18 U/L (ref 0–37)
Albumin: 5 g/dL (ref 3.5–5.2)
Alkaline Phosphatase: 66 U/L (ref 39–117)
BUN: 11 mg/dL (ref 6–23)
CO2: 30 mEq/L (ref 19–32)
Calcium: 10.3 mg/dL (ref 8.4–10.5)
Chloride: 101 mEq/L (ref 96–112)
Creatinine, Ser: 0.93 mg/dL (ref 0.40–1.50)
GFR: 105.74 mL/min (ref >60.00)
Glucose, Bld: 102 mg/dL — ABNORMAL HIGH (ref 70–99)
Potassium: 4 mEq/L (ref 3.5–5.1)
Sodium: 141 mEq/L (ref 135–145)
Total Bilirubin: 0.5 mg/dL (ref 0.2–1.2)
Total Protein: 7.5 g/dL (ref 6.0–8.3)

## 2020-07-28 LAB — SEDIMENTATION RATE: Sed Rate: 3 mm/hr (ref 0–15)

## 2020-07-28 LAB — C-REACTIVE PROTEIN: CRP: <1 mg/dL (ref 0.5–20.0)

## 2020-07-28 NOTE — Progress Notes (Signed)
Established Patient Office Visit  Subjective:  Patient ID: Nicholas Hammond, male    DOB: Jul 11, 1985  Age: 35 y.o. MRN: [id number]  CC:  Chief Complaint  Patient presents with   Chest Pain    Patient states he was had chest pain 07/09/2020 and also passed put that day. Patient has had all kinds of test done but still trying to figure out whats the problem.    HPI Nicholas Hammond presents for chest pain  Upper right chest On and off Worse with bending and certain movements Otherwise cannot reproduce EKG has shown steady in past few weeks His PCP Dr. Jimmey Ralph is working on getting echocardiogram scheduled He does have upcoming appt with cardiology in early July  Denies palpitations, crushing chest pain, headaches, dizziness, increased fatigue, breathing difficulties  He does not note any injury  No other changes.   Past Medical History:  Diagnosis Date   Allergy    History of chicken pox     No past surgical history on file.  Family History  Problem Relation Age of Onset   Crohn's disease Mother    Arthritis Sister    Heart disease Sister        takayasu's arteritis   Rheum arthritis Sister    Crohn's disease Sister    Crohn's disease Brother    Cancer Maternal Grandmother    Heart disease Maternal Grandmother    Heart disease Maternal Grandfather    Heart disease Paternal Grandfather     Social History   Socioeconomic History   Marital status: Single    Spouse name: Not on file   Number of children: Not on file   Years of education: Not on file   Highest education level: Not on file  Occupational History   Not on file  Tobacco Use   Smoking status: Every Day    Packs/day: 1.00    Years: 15.00    Pack years: 15.00    Types: Cigarettes   Smokeless tobacco: Never   Tobacco comments:    No longer smoking cigarettes / VAPE 100 ml's a month  Vaping Use   Vaping Use: Never used  Substance and Sexual Activity   Alcohol use: Yes    Comment: socially    Drug use: Yes    Types: Marijuana    Comment: 1/4 tsp everyday   Sexual activity: Yes  Other Topics Concern   Not on file  Social History Narrative   Not on file   Social Determinants of Health   Financial Resource Strain: Not on file  Food Insecurity: Not on file  Transportation Needs: Not on file  Physical Activity: Not on file  Stress: Not on file  Social Connections: Not on file  Intimate Partner Violence: Not on file    Outpatient Medications Prior to Visit  Medication Sig Dispense Refill   cyclobenzaprine (FLEXERIL) 10 MG tablet Take 10 mg by mouth 3 (three) times daily as needed for muscle spasms.     No facility-administered medications prior to visit.    Allergies  Allergen Reactions   Influenza Vaccines Other (See Comments)    Seizures    ROS Review of Systems Per hpi otherwise negative   Objective:    Physical Exam Constitutional:      General: He is not in acute distress.    Appearance: Normal appearance. He is normal weight. He is not ill-appearing, toxic-appearing or diaphoretic.  Cardiovascular:     Rate and Rhythm: Normal rate  and regular rhythm.     Heart sounds: Normal heart sounds. Heart sounds not distant. No murmur heard. No systolic murmur is present.  No diastolic murmur is present.    No friction rub. No gallop.  Pulmonary:     Effort: Pulmonary effort is normal. No respiratory distress.     Breath sounds: Normal breath sounds. No stridor. No wheezing, rhonchi or rales.  Chest:     Chest wall: No tenderness.    Musculoskeletal:     Cervical back: Normal range of motion.  Neurological:     General: No focal deficit present.     Mental Status: He is alert and oriented to person, place, and time. Mental status is at baseline.  Psychiatric:        Mood and Affect: Mood normal.        Behavior: Behavior normal.        Thought Content: Thought content normal.        Judgment: Judgment normal.    BP (!) 119/93   Pulse (!) 118    Temp 98 F (36.7 C) (Temporal)   Resp 18   Ht 5\' 8"  (1.727 m)   Wt 158 lb 6.4 oz (71.8 kg)   SpO2 97%   BMI 24.08 kg/m  Wt Readings from Last 3 Encounters:  07/28/20 158 lb 6.4 oz (71.8 kg)  07/21/20 155 lb 3.2 oz (70.4 kg)  07/09/20 156 lb (70.8 kg)     There are no preventive care reminders to display for this patient.  There are no preventive care reminders to display for this patient.  No results found for: TSH Lab Results  Component Value Date   WBC 14.1 (H) 07/09/2020   HGB 14.6 07/09/2020   HCT 41.5 07/09/2020   MCV 88.7 07/09/2020   PLT 311 07/09/2020   Lab Results  Component Value Date   NA 141 07/21/2020   K 3.9 07/21/2020   CO2 28 07/21/2020   GLUCOSE 93 07/21/2020   BUN 10 07/21/2020   CREATININE 0.94 07/21/2020   BILITOT 1.0 04/04/2017   ALKPHOS 65 04/04/2017   AST 17 04/04/2017   ALT 15 04/04/2017   PROT 7.6 04/04/2017   ALBUMIN 5.2 04/04/2017   CALCIUM 10.3 07/21/2020   ANIONGAP 10 07/09/2020   GFR 104.40 07/21/2020   Lab Results  Component Value Date   CHOL 191 04/04/2017   Lab Results  Component Value Date   HDL 45.40 04/04/2017   Lab Results  Component Value Date   LDLCALC 129 (H) 04/04/2017   Lab Results  Component Value Date   TRIG 82.0 04/04/2017   Lab Results  Component Value Date   CHOLHDL 4 04/04/2017   No results found for: HGBA1C    Assessment & Plan:   Problem List Items Addressed This Visit   None Visit Diagnoses     Chest pain, unspecified type    -  Primary   Relevant Orders   EKG 12-Lead (Completed)       No orders of the defined types were placed in this encounter.   Follow-up: No follow-ups on file.   PLAN Review of EKG: today shows potential for atrial enlargement and ventricular hypertrophy. Cannot rule out previous risk for ischemic changes. No acute changes from previous study on 07/10/20. Plan to keep appt with cardiology, though atypical nonextertional pain without reproducible qualities  does not seem cardiac in origin to me Will collect labs Given tenderness towards neck and towards L side of  thyroid will get US thyroid to rule out abnormality.unfortunately tenderness substantial enough to limit exam.  Continue to suspect msk etiology. Repeat cxr to rule out rib fracture.  Patient encouraged to call clinic with any questions, comments, or concerns. Janeece Agee, NP

## 2020-07-28 NOTE — Patient Instructions (Addendum)
Mr. Economou -   I think it's important today to rule out causes that are not cardiac in nature - I'll let Drs. Jimmey Ralph and Cristal Deer continue to rule those out.   Labs today will investigate infectious or inflammatory/autoimmune causes. It's likely some results will be back tonight. I'll call with any acute concerns.  We can try to get imaging done for the thyroid and chest xray today - Alcario Drought is working on this right now.  I'll be in touch soon  Thank you  Rich     If you have lab work done today you will be contacted with your lab results within the next 2 weeks.  If you have not heard from [place] then please contact us. The fastest way to get your results is to register for My Chart.   IF you received an x-ray today, you will receive an invoice from [facility]. Please contact [facility] at [phone] with questions or concerns regarding your invoice.   IF you received labwork today, you will receive an invoice from [facility]. Please contact [facility] at [phone] with questions or concerns regarding your invoice.   Our billing staff will not be able to assist you with questions regarding bills from these companies.  You will be contacted with the lab results as soon as they are available. The fastest way to get your results is to activate your My Chart account. Instructions are located on the last page of this paperwork. If you have not heard from [place] regarding the results in 2 weeks, please contact this office.

## 2020-07-28 NOTE — Telephone Encounter (Signed)
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  He is not very happy about this.  Please advise.

## 2020-07-31 ENCOUNTER — Ambulatory Visit: Payer: BC Managed Care – PPO | Admitting: Neurology

## 2020-07-31 ENCOUNTER — Encounter: Payer: Self-pay | Admitting: Neurology

## 2020-07-31 VITALS — BP 148/88 | HR 88 | Ht 69.0 in | Wt 158.0 lb

## 2020-07-31 DIAGNOSIS — R55 Syncope and collapse: Secondary | ICD-10-CM | POA: Diagnosis not present

## 2020-07-31 NOTE — Progress Notes (Signed)
[facility] [address] [place]. [place] [address] [phone]       OFFICE CONSULT NOTE  Mr. Nicholas Hammond Date of Birth:  [birth year] Medical Record Number:  [id number]   Referring MD: Krystal Clark, PA-C  Reason for Referral: Syncope versus seizure  HPI: Mr. Nicholas Hammond is a 35 year old Caucasian male seen today for initial office consultation visit.  History is obtained from the patient and review of electronic medical records and ER visit.  I have reviewed pertinent imaging films in PACS.  He has no significant past medical history except tobacco abuse.  On 07/09/2020 while at home and walking from his bedroom to the kitchen he suddenly turned to the right side to throw a coozie onto the counter and noticed sudden onset of sharp pain in the right chest like a muscle pull.  He tried to stretch to see if it helps with the pain and sat down. He noticed that he felt lightheaded, nauseous and broke out into a sweat.  He lowered his head between his knees and lost consciousness.  His wife who was at home found him lying on the ground on his back with eyes rolling up and fluttering and some brief jerky muscle movements which lasted barely a minute or so.  Patient also had urinary incontinence but did not bite his tongue.  He appeared slightly disoriented and confused but that did not stay for long period he had a mild headache after that and felt nauseous.  Patient denied any other episodes of syncope or near syncope in the past.  He does have remote history of seizures between ages 69 and 47 but he blames this on being allergic to the incubated egg yolk which was used in MMR and flu shots.  He has had no other seizure episodes and was never on anticonvulsants.  Denies any significant head injury with loss of consciousness, strokes, seizures or migraines.  He scheduled to undergo an echocardiogram and see a cardiologist later this month.  He did have a CT scan of the head done on  07/10/2020 which I personally reviewed and was normal.  Lab work during the ER visit was fairly unremarkable except for low potassium of 2.9 which was replaced.  He has no complaints today.  He denies being sleep deprived abusing drugs.  He states he may have been little bit dehydrated and not eating well since he had had some dental extraction done a few weeks ago and was mostly on a soft diet prior to this episode.  ROS:   14 system review of systems is positive for loss of consciousness , syncope, incontinence, chest pain all other systems negative  PMH:  Past Medical History:  Diagnosis Date   Allergy    History of chicken pox     Social History:  Social History   Socioeconomic History   Marital status: Single    Spouse name: Not on file   Number of children: Not on file   Years of education: Not on file   Highest education level: Not on file  Occupational History   Not on file  Tobacco Use   Smoking status: Former    Packs/day: 1.00    Years: 15.00    Pack years: 15.00    Types: Cigarettes   Smokeless tobacco: Never   Tobacco comments:    No longer smoking cigarettes / VAPE 100 ml's a month  Vaping Use   Vaping Use: Every day  Substance and Sexual  Activity   Alcohol use: Yes    Comment: socially   Drug use: Yes    Types: Marijuana    Comment: 1/4 tsp everyday   Sexual activity: Yes  Other Topics Concern   Not on file  Social History Narrative   Lives with fiance   Left Handed   Drinks 4-6 cups caffeine daily   Social Determinants of Health   Financial Resource Strain: Not on file  Food Insecurity: Not on file  Transportation Needs: Not on file  Physical Activity: Not on file  Stress: Not on file  Social Connections: Not on file  Intimate Partner Violence: Not on file    Medications:   Current Outpatient Medications on File Prior to Visit  Medication Sig Dispense Refill   cyclobenzaprine (FLEXERIL) 10 MG tablet Take 10 mg by mouth 3 (three) times daily  as needed for muscle spasms.     [DISCONTINUED] dicyclomine (BENTYL) 10 MG capsule Take 1 capsule (10 mg total) by mouth 4 (four) times daily -  before meals and at bedtime. (Patient not taking: Reported on 04/04/2017) 120 capsule 1   No current facility-administered medications on file prior to visit.    Allergies:   Allergies  Allergen Reactions   Influenza Vaccines Other (See Comments)    Seizures    Physical Exam General: well developed, well nourished, young Caucasian male seated, in no evident distress Head: head normocephalic and atraumatic.   Neck: supple with no carotid or supraclavicular bruits Cardiovascular: regular rate and rhythm, no murmurs Musculoskeletal: no deformity Skin:  no rash/petichiae Vascular:  Normal pulses all extremities  Neurologic Exam Mental Status: Awake and fully alert. Oriented to place and time. Recent and remote memory intact. Attention span, concentration and fund of knowledge appropriate. Mood and affect appropriate.  Cranial Nerves: Fundoscopic exam reveals sharp disc margins. Pupils equal, briskly reactive to light. Extraocular movements full without nystagmus. Visual fields full to confrontation. Hearing intact. Facial sensation intact. Face, tongue, palate moves normally and symmetrically.  Motor: Normal bulk and tone. Normal strength in all tested extremity muscles. Sensory.: intact to touch , pinprick , position and vibratory sensation.  Coordination: Rapid alternating movements normal in all extremities. Finger-to-nose and heel-to-shin performed accurately bilaterally. Gait and Station: Arises from chair without difficulty. Stance is normal. Gait demonstrates normal stride length and balance . Able to heel, toe and tandem walk without difficulty.  Reflexes: 1+ and symmetric. Toes downgoing.       ASSESSMENT:  35 year old Caucasian male with episode of brief loss of consciousness likely convulsive syncope  rather than  a seizure.  Remote  history of childhood seizures.    PLAN: I had a long discussion with the patient regarding his episode of brief loss of consciousness which sounds like a convulsive syncope.  He however has remote history of seizures since I recommend further evaluation by checking EEG as well as MRI scan of the brain.  He was also encouraged to continue his upcoming appointment with the cardiology for cardiac work-up.  He was advised not to drive for 6 months as per [facility].  He will return for follow-up in the future in 3 months or call earlier if necessary.  Greater than 50% time during this 45-minute consultation visit was spent on counseling and coordination of care about his episode of syncope and brief loss of consciousness and answering questions  Antony Contras, MD Note: This document was prepared with digital dictation and possible smart phrase technology. Any transcriptional  errors that result from this process are unintentional.

## 2020-07-31 NOTE — Patient Instructions (Signed)
I had a long discussion with the patient regarding his episode of brief loss of consciousness which sounds like a convulsive syncope.  He however has remote history of seizures since I recommend further evaluation by checking EEG as well as MRI scan of the brain.  He was also encouraged to continue his upcoming appointment with the cardiology for cardiac work-up.  He was advised not to drive for 6 months as per [facility].  He will return for follow-up in the future in 3 months or call earlier if necessary.

## 2020-08-01 ENCOUNTER — Telehealth: Payer: Self-pay | Admitting: Neurology

## 2020-08-01 NOTE — Telephone Encounter (Addendum)
MRI brain w/wo contrast [employer] auth: [id number] (08/01/20-08/30/20)  Scheduled at [facility] 08/08/20 at 9:00 am.

## 2020-08-02 ENCOUNTER — Telehealth: Payer: Self-pay | Admitting: Registered Nurse

## 2020-08-02 NOTE — Telephone Encounter (Signed)
Spoke with the pt he tested pos. For covid, he will call back at a later time to get the bw done.

## 2020-08-07 ENCOUNTER — Other Ambulatory Visit: Payer: BC Managed Care – PPO

## 2020-08-08 ENCOUNTER — Other Ambulatory Visit: Payer: BC Managed Care – PPO

## 2020-08-11 ENCOUNTER — Telehealth: Payer: Self-pay

## 2020-08-11 NOTE — Telephone Encounter (Signed)
Last Saturday had a incident heart rate reached 150 not doing anything pt was taking cyclobenzaprine (FLEXERIL) 10 MG tablet  pt has been out a week ago heart rate stays above 100 has a eco cardiagram next Friday. Heart Rate is 123 at this time. I have forwared to a triage nurse. Pt last seen here on 06/15 with Janeece Agee   Pt call back [phone]

## 2020-08-14 ENCOUNTER — Other Ambulatory Visit: Payer: BC Managed Care – PPO

## 2020-08-15 ENCOUNTER — Ambulatory Visit: Payer: BC Managed Care – PPO | Admitting: Neurology

## 2020-08-15 DIAGNOSIS — R55 Syncope and collapse: Secondary | ICD-10-CM

## 2020-08-16 ENCOUNTER — Other Ambulatory Visit: Payer: BC Managed Care – PPO

## 2020-08-18 ENCOUNTER — Ambulatory Visit (HOSPITAL_COMMUNITY): Payer: BC Managed Care – PPO | Attending: Family Medicine

## 2020-08-18 ENCOUNTER — Other Ambulatory Visit: Payer: Self-pay

## 2020-08-18 DIAGNOSIS — R55 Syncope and collapse: Secondary | ICD-10-CM | POA: Diagnosis not present

## 2020-08-18 LAB — ECHOCARDIOGRAM COMPLETE
Area-P 1/2: 4.17 cm2
S' Lateral: 2.9 cm

## 2020-08-18 NOTE — Progress Notes (Signed)
Kindly inform the patient that EEG study was normal.  No seizure activity was noted.

## 2020-08-18 NOTE — Progress Notes (Signed)
Please inform patient of the following:  Echocardiogram is NORMAL.  Katina Degree. Jimmey Ralph, MD 08/18/2020 5:44 PM

## 2020-08-22 ENCOUNTER — Ambulatory Visit: Payer: BC Managed Care – PPO

## 2020-08-22 ENCOUNTER — Other Ambulatory Visit: Payer: Self-pay

## 2020-08-22 ENCOUNTER — Telehealth: Payer: Self-pay | Admitting: *Deleted

## 2020-08-22 DIAGNOSIS — R55 Syncope and collapse: Secondary | ICD-10-CM

## 2020-08-22 MED ORDER — GADOBENATE DIMEGLUMINE 529 MG/ML IV SOLN
15.0000 mL | Freq: Once | INTRAVENOUS | Status: AC | PRN
  Administered 2020-08-22: 15 mL via INTRAVENOUS

## 2020-08-22 NOTE — Telephone Encounter (Signed)
Left patient a detailed message, with results, on voicemail (ok per DPR).  Provided our number to call back with any questions.  

## 2020-08-22 NOTE — Telephone Encounter (Signed)
-----   Message from Micki Riley, MD sent at 08/18/2020  3:49 PM EDT ----- Joneen Roach inform the patient that EEG study was normal.  No seizure activity was noted.

## 2020-08-29 ENCOUNTER — Ambulatory Visit: Payer: PRIVATE HEALTH INSURANCE | Admitting: Diagnostic Neuroimaging

## 2020-08-31 ENCOUNTER — Other Ambulatory Visit: Payer: Self-pay

## 2020-08-31 ENCOUNTER — Ambulatory Visit: Payer: BC Managed Care – PPO | Admitting: Family Medicine

## 2020-08-31 ENCOUNTER — Encounter: Payer: Self-pay | Admitting: *Deleted

## 2020-08-31 VITALS — BP 115/77 | HR 88 | Temp 98.0°F | Ht 69.0 in | Wt 157.2 lb

## 2020-08-31 DIAGNOSIS — R0789 Other chest pain: Secondary | ICD-10-CM | POA: Diagnosis not present

## 2020-08-31 DIAGNOSIS — F419 Anxiety disorder, unspecified: Secondary | ICD-10-CM

## 2020-08-31 DIAGNOSIS — F320 Major depressive disorder, single episode, mild: Secondary | ICD-10-CM

## 2020-08-31 MED ORDER — CYCLOBENZAPRINE HCL 10 MG PO TABS: 10.0000 mg | ORAL_TABLET | Freq: Three times a day (TID) | ORAL | 5 refills | Status: DC | PRN

## 2020-08-31 NOTE — Assessment & Plan Note (Signed)
Elevated PHQ score.  He has been following with psychology at the [place].  This seems to be going well.

## 2020-08-31 NOTE — Assessment & Plan Note (Signed)
Could be contributing some to his chest wall pain.  He is following with behavioral health at the [place].  Not currently on any medications.  Seems to be improving with therapy.

## 2020-08-31 NOTE — Patient Instructions (Signed)
It was very nice to see you today!  We will refill your Flexeril today.  I am glad all your testing so far has been negative.  You are probably having musculoskeletal chest pain.  We can see you back in a few months to recheck blood work.  Take care, Dr Jimmey Ralph  PLEASE NOTE:  If you had any lab tests please let us know if you have not heard back within a few days. You may see your results on mychart before we have a chance to review them but we will give you a call once they are reviewed by [place]. If we ordered any referrals today, please let us know if you have not heard from their office within the next week.   Please try these tips to maintain a healthy lifestyle:  Eat at least 3 REAL meals and 1-2 snacks per day.  Aim for no more than 5 hours between eating.  If you eat breakfast, please do so within one hour of getting up.   Each meal should contain half fruits/vegetables, one quarter protein, and one quarter carbs (no bigger than a computer mouse)  Cut down on sweet beverages. This includes juice, soda, and sweet tea.   Drink at least 1 glass of water with each meal and aim for at least 8 glasses per day  Exercise at least 150 minutes every week.

## 2020-08-31 NOTE — Progress Notes (Signed)
   Nicholas Hammond is a 34 y.o. male who presents today for an office visit.  Assessment/Plan:  New/Acute Problems: Chest wall pain We will refill Flexeril today.  Will be following with cardiology soon though his history is atypical for any cardiac etiology and has had work-up including EKG and echocardiogram which again negative.  Discussed reasons to return to care.  Vasovagal syncope Has followed with neurology since her last visit.  No recurrent episodes.  Chronic Problems Addressed Today: Depression, major, single episode, mild (HCC) Elevated PHQ score.  He has been following with psychology at the [place].  This seems to be going well.  Anxiety Could be contributing some to his chest wall pain.  He is following with behavioral health at the [place].  Not currently on any medications.  Seems to be improving with therapy.     Subjective:  HPI: He is here to discuss his blood work that was done previously but never received his results from another office.  He had some muscular chest pain in the past and recommended to start a high potassium diet which has helped some. Although he continues to have some mild pain. He had one episode when he experienced chest discomfort and shortness of breath 2 weeks ago. However, these symptoms have resolved-no other episodes noted since this time.   His heart rate was 140 during his chest pain episode. He is seeing a cardiologist soon for further evaluation. He has developed anxiety from these stressors. Contributes his heightened anxiety secondary to quitting marijuana use. Follows psychology and it has been going well. His blood pressure is stable and he admits to having white coat syndrome. He stop working out and cycling due to his previous health condition.       Objective:  Physical Exam: BP 115/77   Pulse 88   Temp 98 F (36.7 C) (Temporal)   Ht 5\' 9"  (1.753 m)   Wt 157 lb 3.2 oz (71.3 kg)   SpO2 99%   BMI 23.21 kg/m   Gen: No acute  distress, resting comfortably Neuro: Grossly normal, moves all extremities Psych: Normal affect and thought content   I,Savera Zaman,acting as a scribe for , MD.,have documented all relevant documentation on the behalf of Jacquiline Doe, MD,as directed by  Jacquiline Doe, MD while in the presence of Jacquiline Doe, MD.   I, Jacquiline Doe, MD, have reviewed all documentation for this visit. The documentation on 08/31/20 for the exam, diagnosis, procedures, and orders are all accurate and complete.      09/02/20. Katina Degree, MD 08/31/2020 9:04 AM

## 2020-09-04 ENCOUNTER — Ambulatory Visit (INDEPENDENT_AMBULATORY_CARE_PROVIDER_SITE_OTHER): Payer: BC Managed Care – PPO | Admitting: Cardiology

## 2020-09-04 ENCOUNTER — Encounter (HOSPITAL_BASED_OUTPATIENT_CLINIC_OR_DEPARTMENT_OTHER): Payer: Self-pay | Admitting: Cardiology

## 2020-09-04 ENCOUNTER — Other Ambulatory Visit: Payer: Self-pay

## 2020-09-04 VITALS — BP 114/80 | HR 88 | Ht 69.0 in | Wt 159.0 lb

## 2020-09-04 DIAGNOSIS — Z7189 Other specified counseling: Secondary | ICD-10-CM

## 2020-09-04 DIAGNOSIS — R55 Syncope and collapse: Secondary | ICD-10-CM | POA: Diagnosis not present

## 2020-09-04 DIAGNOSIS — R0789 Other chest pain: Secondary | ICD-10-CM

## 2020-09-04 NOTE — Patient Instructions (Signed)
Medication Instructions:  No new medications *If you need a refill on your cardiac medications before your next appointment, please call your pharmacy*   Lab Work: None If you have labs (blood work) drawn today and your tests are completely normal, you will receive your results only by: MyChart Message (if you have MyChart) OR A paper copy in the mail If you have any lab test that is abnormal or we need to change your treatment, we will call you to review the results.   Testing/Procedures: None  Follow-Up: At [facility], you and your health needs are our priority.  As part of our continuing mission to provide you with exceptional heart care, we have created designated Provider Care Teams.  These Care Teams include your primary Cardiologist (physician) and Advanced Practice Providers (APPs -  Physician Assistants and Nurse Practitioners) who all work together to provide you with the care you need, when you need it.  We recommend signing up for the patient portal called "MyChart".  Sign up information is provided on this After Visit Summary.  MyChart is used to connect with patients for Virtual Visits (Telemedicine).  Patients are able to view lab/test results, encounter notes, upcoming appointments, etc.  Non-urgent messages can be sent to your provider as well.   To learn more about what you can do with MyChart, go to [email or web address].    Your next appointment:   As needed  The format for your next appointment:   In Person  Provider:   Jodelle Red, MD   Other Instructions Please call if you have any issues or concerns.

## 2020-09-04 NOTE — Progress Notes (Signed)
Cardiology Office Note:    Date:  09/04/2020   ID:  Nicholas Hammond, DOB Nov 08, 1985, MRN [id number]  PCP:  Ardith Dark, MD  Cardiologist:  Jodelle Red, MD  Referring MD: Ardith Dark, MD   CC: new patient consultation for chest pain and syncope  History of Present Illness:    Nicholas Hammond is a 35 y.o. male with a hx of chest pain, syncope who is seen as a new consult at the request of Ardith Dark, MD for the evaluation and management of chest pain.  Today: Reviewed recent note from Dr. Jimmey Ralph dated 08/31/2020. Also reviewed notes prior to this. Has recently experienced chest pain and also had syncope with concern for possible seizure; has been seen by neurology and had normal EEG and normal MRI brain. I reviewed his echo from 08/18/20 as well.  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 His sister has cardiac issues.  He is feeling good overall. His PCP recently increased his medication, and this is helping with his musculoskeletal issues.  His chest pain began after his fall in 05/2020. He fell and landed on a tile floor, and felt some crushing chest pain. Occasionally he continues to have chest pain, but he denies having severe or crushing pain at this time. Taking tylenol or using IcyHot will relieve his pain. He has played golf without experiencing any symptoms aside from soreness.   Prior to his fall he did not have any chest pain. Also prior, he had injured his teeth that prevented him from ingesting anything other  than liquids. For a time afterwards, he occasionally skipped up to 2 meals a day.  In April, he had one episode of his heart rate up to 150 while he was not active. 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  For his diet, he has cut back on sodas and caffeine. He does vape, but has been cutting back on this as well.  He denies any shortness of breath, or palpitations. No headaches, lightheadedness, or syncope to report. Also has no lower extremity edema, orthopnea or PND.   Past Medical History:  Diagnosis Date   Allergy    History of chicken pox     No past surgical history on file.  Current Medications: Current Outpatient Medications on File Prior to Visit  Medication Sig   cyclobenzaprine (FLEXERIL) 10 MG tablet Take 1 tablet (10 mg total) by mouth 3 (three) times daily as needed for muscle spasms.   [DISCONTINUED] dicyclomine (BENTYL) 10 MG capsule Take 1 capsule (10 mg total) by mouth 4 (four) times daily -  before meals and at bedtime. (Patient not taking: Reported on 04/04/2017)   No current facility-administered medications on file prior to visit.     Allergies:   Influenza vaccines   Social History   Tobacco Use   Smoking status: Former    Packs/day: 1.00    Years: 15.00    Pack years: 15.00    Types: Cigarettes  Smokeless tobacco: Never   Tobacco comments:    No longer smoking cigarettes / VAPE 100 ml's a month  Vaping Use   Vaping Use: Every day  Substance Use Topics   Alcohol use: Yes    Comment: socially   Drug use: Yes    Types: Marijuana    Comment: 1/4 tsp everyday    Family History: family history includes Arthritis in his sister; Cancer in his maternal grandmother; Crohn's disease in his brother, mother, and sister; Heart disease in his maternal grandfather, maternal grandmother, paternal grandfather, and sister;  Rheum arthritis in his sister.  ROS:   Please see the history of present illness.  Additional pertinent ROS: Constitutional: Negative for chills, fever, night sweats, unintentional weight loss  HENT: Positive for tinitis. Negative for ear pain and hearing loss.   Eyes: Negative for loss of vision and eye pain.  Respiratory: Negative for cough, sputum, wheezing.   Cardiovascular: See HPI. Gastrointestinal: Negative for abdominal pain, melena, and hematochezia.  Genitourinary: Negative for dysuria and hematuria.  Musculoskeletal: Negative for falls and myalgias.  Skin: Negative for itching and rash.  Neurological: Negative for focal weakness, focal sensory changes and loss of consciousness.  Endo/Heme/Allergies: Does not bruise/bleed easily.     EKGs/Labs/Other Studies Reviewed:    The following studies were reviewed today:  Echo 08/18/2020 1. Left ventricular ejection fraction, by estimation, is 60 to 65%. Left  ventricular ejection fraction by 3D volume is 66 %. The left ventricle has  normal function. The left ventricle has no regional wall motion  abnormalities. Left ventricular diastolic   parameters were normal.   2. Right ventricular systolic function is normal. The right ventricular  size is normal.   3. The mitral valve is normal in structure. No evidence of mitral valve  regurgitation. No evidence of mitral stenosis.   4. The aortic valve is normal in structure. Aortic valve regurgitation is  not visualized. No aortic stenosis is present.   5. The inferior vena cava is normal in size with greater than 50%  respiratory variability, suggesting right atrial pressure of 3 mmHg.   EKG:  EKG is personally reviewed.   09/04/2020: NSR, nonspecific st pattern, 88 bpm  Recent Labs: 07/10/2020: Magnesium 2.1 07/28/2020: ALT 24; BUN 11; Creatinine, Ser 0.93; Hemoglobin 16.0; Platelets 227.0; Potassium 4.0; Sodium 141  Recent Lipid Panel    Component Value Date/Time   CHOL 191  04/04/2017 1027   TRIG 82.0 04/04/2017 1027   HDL 45.40 04/04/2017 1027   CHOLHDL 4 04/04/2017 1027   VLDL 16.4 04/04/2017 1027   LDLCALC 129 (H) 04/04/2017 1027    Physical Exam:    VS:  BP 114/80   Pulse 88   Ht 5\' 9"  (1.753 m)   Wt 159 lb (72.1 kg)   SpO2 98%   BMI 23.48 kg/m    Orthostatics: Lying 109/60, HR 83 Sitting 113/79, HR 88 Standing 112/84, HR 91 Standing 3 min 113/87, HR 92  Wt Readings from Last 3 Encounters:  09/04/20 159 lb (72.1 kg)  08/31/20 157 lb 3.2 oz (71.3 kg)  07/31/20 158 lb (71.7 kg)    GEN: Well nourished, well developed in no acute distress HEENT: Normal, moist mucous membranes NECK: No JVD CARDIAC: regular rhythm, normal S1 and S2, no rubs or gallops. No murmur. VASCULAR: Radial and DP pulses 2+ bilaterally. No carotid bruits RESPIRATORY:  Clear to auscultation without rales, wheezing or rhonchi  ABDOMEN: Soft, non-tender, non-distended MUSCULOSKELETAL:  Ambulates independently SKIN:  Warm and dry, no edema NEUROLOGIC:  Alert and oriented x 3. No focal neuro deficits noted. PSYCHIATRIC:  Normal affect    ASSESSMENT:    1. Syncope and collapse   2. Chest wall pain   3. Cardiac risk counseling   4. Counseling on health promotion and disease prevention    PLAN:    Chest pain: -atypical, improving with MSK treatment -counseled on red flag warning signs that need immediate medical attention  Syncope: -has seen neurology given concern for possible seizure; EEG and MRI brain normal -reviewed echo, normal -no further events -not orthostatic -low suspicion for cardiac etiology  Cardiac risk counseling and prevention recommendations: -recommend heart healthy/Mediterranean diet, with whole grains, fruits, vegetable, fish, lean meats, nuts, and olive oil. Limit salt. -recommend moderate walking, 3-5 times/week for 30-50 minutes each session. Aim for at least 150 minutes.week. Goal should be pace of 3 miles/hours, or walking 1.5 miles in  30 minutes -recommend avoidance of tobacco products. Avoid excess alcohol. -ASCVD risk score: The ASCVD Risk score Denman George DC Jr., et al., 2013) failed to calculate for the following reasons:   The 2013 ASCVD risk score is only valid for ages 20 to 10    Plan for follow up: 1 year or sooner as needed.  Jodelle Red, MD, PhD, [facility]  [facility]    Medication Adjustments/Labs and Tests Ordered: Current medicines are reviewed at length with the patient today.  Concerns regarding medicines are outlined above.  Orders Placed This Encounter  Procedures   EKG 12-Lead   No orders of the defined types were placed in this encounter.   Patient Instructions  Medication Instructions:  No new medications *If you need a refill on your cardiac medications before your next appointment, please call your pharmacy*   Lab Work: None If you have labs (blood work) drawn today and your tests are completely normal, you will receive your results only by: MyChart Message (if you have MyChart) OR A paper copy in the mail If you have any lab test that is abnormal or we need to change your treatment, we will call you to review the results.   Testing/Procedures: None  Follow-Up: At [facility], you and your health needs are our priority.  As part of our continuing mission to provide you with exceptional heart care, we have created designated Provider Care Teams.  These Care Teams include your primary Cardiologist (physician) and Advanced Practice Providers (APPs -  Physician Assistants and Nurse Practitioners) who all work together to provide you with the care you need, when you need it.  We recommend signing up for the patient portal called "MyChart".  Sign up information is provided on this After Visit Summary.  MyChart is used to connect with patients for Virtual Visits (Telemedicine).  Patients are able to view lab/test results, encounter notes, upcoming appointments, etc.   Non-urgent messages can be sent to your provider as well.   To learn more about what you can do with MyChart, go to [email or web address].    Your next appointment:   As needed  The format for your next appointment:   In Person  Provider:   Jodelle Red, MD   Other Instructions Please call if you have any issues or concerns.    I,Mathew Stumpf,acting as a Neurosurgeon for [employer], MD.,have documented all relevant documentation on the behalf of Jodelle Red, MD,as directed by  Jodelle Red, MD while in the presence of Jodelle Red, MD.  I, [place]  Cristal Deerhristopher, MD, have reviewed all documentation for this visit. The documentation on 10/14/20 for the exam, diagnosis, procedures, and orders are all accurate and complete.   Signed, Jodelle RedBridgette Cheetara Hoge, MD PhD 09/04/2020     [facility]

## 2020-10-14 ENCOUNTER — Encounter (HOSPITAL_BASED_OUTPATIENT_CLINIC_OR_DEPARTMENT_OTHER): Payer: Self-pay | Admitting: Cardiology

## 2020-11-20 ENCOUNTER — Ambulatory Visit: Payer: BC Managed Care – PPO | Admitting: Neurology

## 2020-11-20 ENCOUNTER — Encounter: Payer: Self-pay | Admitting: Neurology

## 2020-11-20 VITALS — BP 116/79 | HR 89 | Ht 69.0 in | Wt 174.6 lb

## 2020-11-20 DIAGNOSIS — R55 Syncope and collapse: Secondary | ICD-10-CM

## 2020-11-20 NOTE — Patient Instructions (Signed)
I had a long discussion with the patient regarding his episode of brief loss of consciousness likely syncopal event.  Neurological as well as cardiac work-up seems unrevealing.  I recommend he does not drive till 6 months after the event as per [facility].  No further neurological testing is needed at this time.  He may return for follow-up in the future only as needed.

## 2020-11-20 NOTE — Progress Notes (Signed)
[facility] [address] [place]. [place] [address] [phone]       OFFICE CONSULT NOTE  Mr. Nicholas Hammond Date of Birth:  1986-01-29 Medical Record Number:  [id number]   Referring MD: Krystal Clark, PA-C  Reason for Referral: Syncope versus seizure  HPI: Nicholas Hammond is a 35 year old Caucasian male seen today for initial office consultation visit.  History is obtained from the patient and review of electronic medical records and ER visit.  I have reviewed pertinent imaging films in PACS.  He has no significant past medical history except tobacco abuse.  On 07/09/2020 while at home and walking from his bedroom to the kitchen he suddenly turned to the right side to throw a coozie onto the counter and noticed sudden onset of sharp pain in the right chest like a muscle pull.  He tried to stretch to see if it helps with the pain and sat down. He noticed that he felt lightheaded, nauseous and broke out into a sweat.  He lowered his head between his knees and lost consciousness.  His wife who was at home found him lying on the ground on his back with eyes rolling up and fluttering and some brief jerky muscle movements which lasted barely a minute or so.  Patient also had urinary incontinence but did not bite his tongue.  He appeared slightly disoriented and confused but that did not stay for long period he had a mild headache after that and felt nauseous.  Patient denied any other episodes of syncope or near syncope in the past.  He does have remote history of seizures between ages 36 and 82 but he blames this on being allergic to the incubated egg yolk which was used in MMR and flu shots.  He has had no other seizure episodes and was never on anticonvulsants.  Denies any significant head injury with loss of consciousness, strokes, seizures or migraines.  He scheduled to undergo an echocardiogram and see a cardiologist later this month.  He did have a CT scan of the head done on  07/10/2020 which I personally reviewed and was normal.  Lab work during the ER visit was fairly unremarkable except for low potassium of 2.9 which was replaced.  He has no complaints today.  He denies being sleep deprived abusing drugs.  He states he may have been little bit dehydrated and not eating well since he had had some dental extraction done a few weeks ago and was mostly on a soft diet prior to this episode. Update 11/20/2020 : He returns for follow-up after last visit 4 months ago.  He is doing well and has had no further episodes of syncopal or near syncopal events.  He underwent EEG on 08/16/2020 which was normal.  MRI scan of the brain on 08/24/2020 was also normal.  He saw Dr. Shelton Silvas cardiologist who felt he did not have any significant underlying cardiac abnormalities.  EKG was unremarkable and echocardiogram on 08/18/2020 was also normal.  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  He has no new complaints today.  He has not been driving as instructed. ROS:   14 system review of systems is positive for loss of consciousness , syncope, incontinence, chest pain all other systems negative  PMH:  Past Medical History:  Diagnosis Date   Allergy    History of chicken pox    Syncope and collapse     Social History:  Social  History   Socioeconomic History   Marital status: Single    Spouse name: Not on file   Number of children: Not on file   Years of education: Not on file   Highest education level: Not on file  Occupational History   Not on file  Tobacco Use   Smoking status: Former    Packs/day: 1.00    Years: 15.00    Pack years: 15.00    Types: Cigarettes   Smokeless tobacco: Never  Vaping Use   Vaping Use: Every day  Substance and Sexual Activity   Alcohol use: Yes    Comment: socially   Drug use: Not Currently    Comment: 1/4 tsp everyday   Sexual activity: Yes  Other Topics Concern   Not on file  Social  History Narrative   Lives with fiance   Left Handed   Drinks 4-6 cups caffeine daily   Social Determinants of Health   Financial Resource Strain: Not on file  Food Insecurity: Not on file  Transportation Needs: Not on file  Physical Activity: Not on file  Stress: Not on file  Social Connections: Not on file  Intimate Partner Violence: Not on file    Medications:   Current Outpatient Medications on File Prior to Visit  Medication Sig Dispense Refill   acetaminophen (TYLENOL) 500 MG tablet Take 500 mg by mouth as needed.     cyclobenzaprine (FLEXERIL) 10 MG tablet Take 1 tablet (10 mg total) by mouth 3 (three) times daily as needed for muscle spasms. 30 tablet 5   No current facility-administered medications on file prior to visit.    Allergies:   Allergies  Allergen Reactions   Influenza Vaccines Other (See Comments)    Seizures    Physical Exam General: well developed, well nourished, young Caucasian male seated, in no evident distress Head: head normocephalic and atraumatic.   Neck: supple with no carotid or supraclavicular bruits Cardiovascular: regular rate and rhythm, no murmurs Musculoskeletal: no deformity Skin:  no rash/petichiae Vascular:  Normal pulses all extremities  Neurologic Exam Mental Status: Awake and fully alert. Oriented to place and time. Recent and remote memory intact. Attention span, concentration and fund of knowledge appropriate. Mood and affect appropriate.  Cranial Nerves: Fundoscopic exam not done. Pupils equal, briskly reactive to light. Extraocular movements full without nystagmus. Visual fields full to confrontation. Hearing intact. Facial sensation intact. Face, tongue, palate moves normally and symmetrically.  Motor: Normal bulk and tone. Normal strength in all tested extremity muscles. Sensory.: intact to touch , pinprick , position and vibratory sensation.  Coordination: Rapid alternating movements normal in all extremities.  Finger-to-nose and heel-to-shin performed accurately bilaterally. Gait and Station: Arises from chair without difficulty. Stance is normal. Gait demonstrates normal stride length and balance . Able to heel, toe and tandem walk without difficulty.  Reflexes: 1+ and symmetric. Toes downgoing.       ASSESSMENT:  35 year old Caucasian male with solitary episode of brief loss of consciousness likely convulsive syncope  rather than  a seizure.  Remote history of childhood seizures.  Unremarkable brain imaging and EEG    PLAN: I had a long discussion with the patient regarding his episode of brief loss of consciousness which sounds like a convulsive syncope.  He however has remote history of seizures since I recommend further evaluation by checking EEG as well as MRI scan of the brain.  He was also encouraged to continue his upcoming appointment with the cardiology for cardiac work-up.  He was advised not to drive for 6 months as per [facility].  He will return for follow-up in the future in 3 months or call earlier if necessary.  Greater than 50% time during this 25-minute  visit was spent on counseling and coordination of care about his episode of syncope and brief loss of consciousness and answering questions  Antony Contras, MD Note: This document was prepared with digital dictation and possible smart phrase technology. Any transcriptional errors that result from this process are unintentional.

## 2021-06-09 IMAGING — CT CT HEAD W/O CM
4 series · 16 of 47 positions shown, 18 images · non-contrast
Comparison: None.

CLINICAL DATA: Seizure

EXAM:
CT HEAD WITHOUT CONTRAST
TECHNIQUE: Contiguous axial images were obtained from the base of the skull
through the vertex without intravenous contrast.

[Series 3: head wo · axial · 0.47mm/px · z∈[-89,+31]mm · 7 of 33 slices shown, 9 images]
[im 5/33  brain]
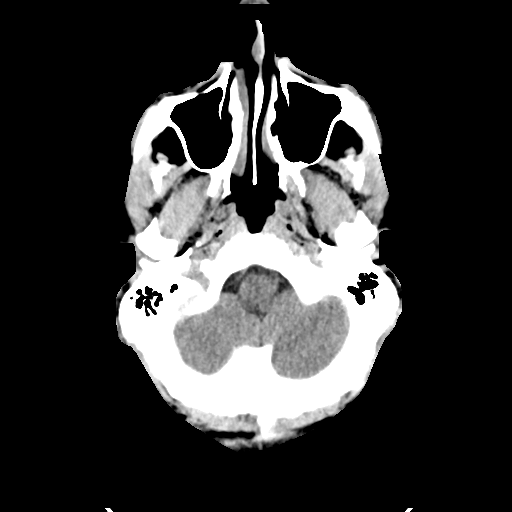
[im 5/33  bone]
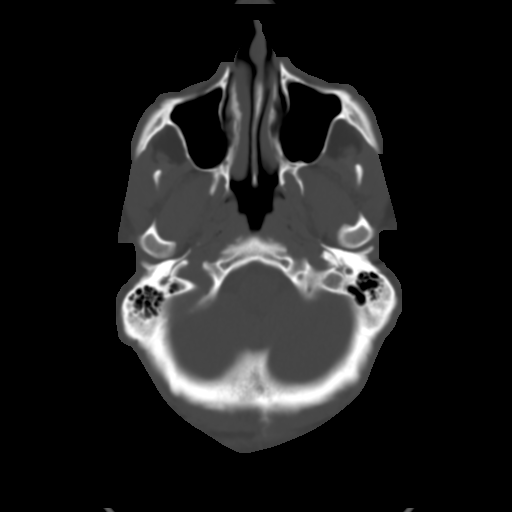
[im 9/33  brain]
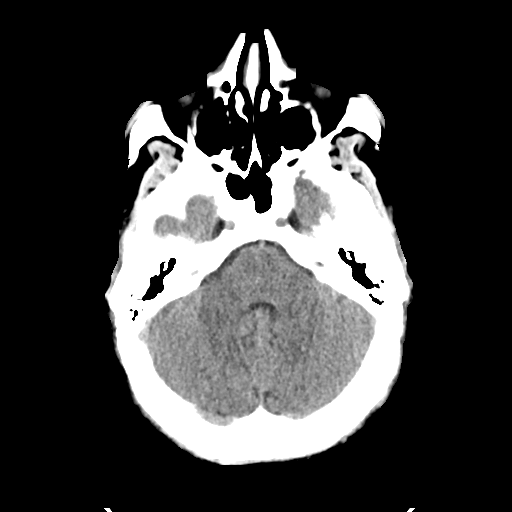
[im 13/33  brain]
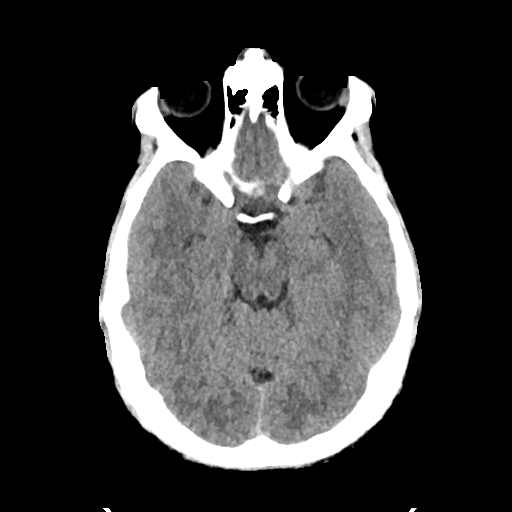
[im 17/33  brain]
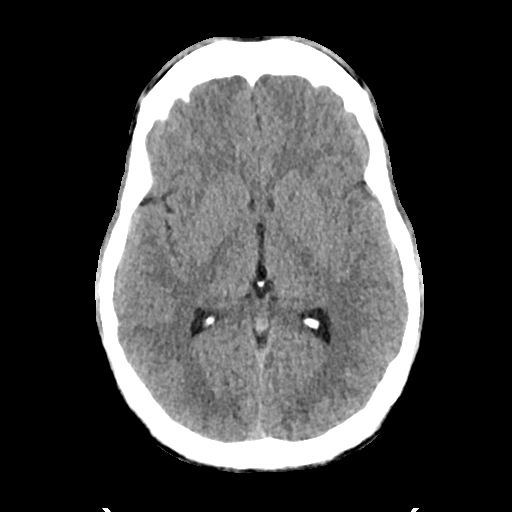
[im 21/33  brain]
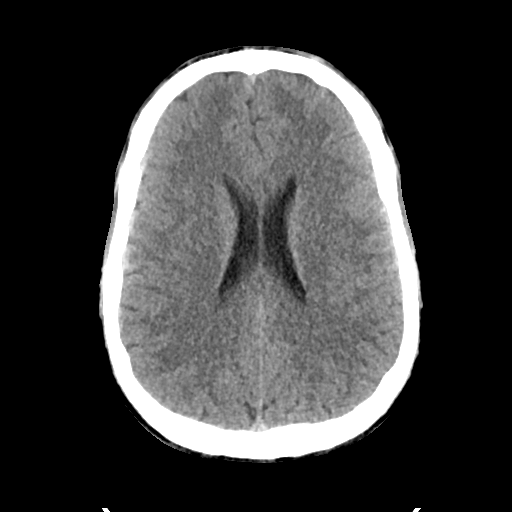
[im 21/33  bone]
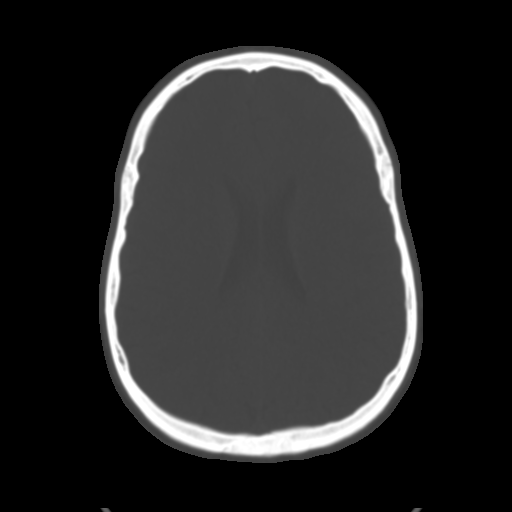
[im 25/33  brain]
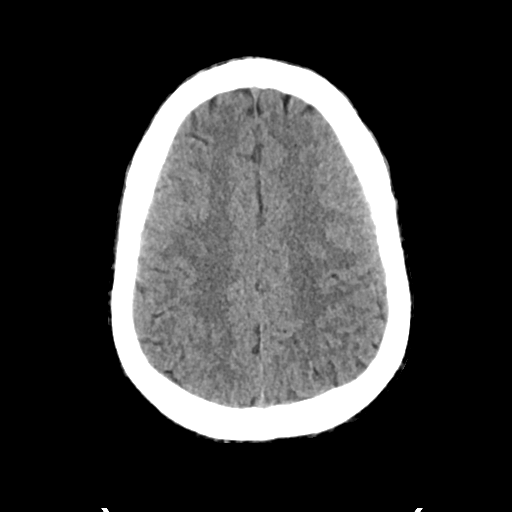
[im 29/33  brain]
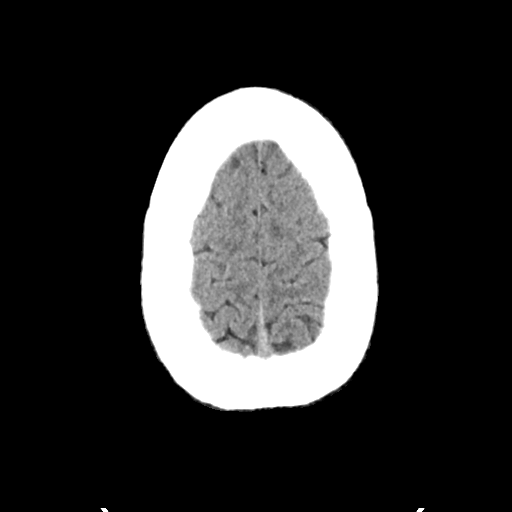

[Series 4: head bone · axial · 0.47mm/px · z∈[-93,-61]mm · 3 of 82 slices shown]
[im 9/82  bone]
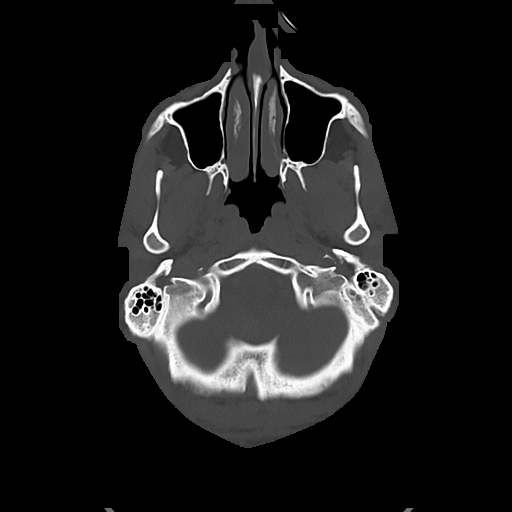
[im 17/82  bone]
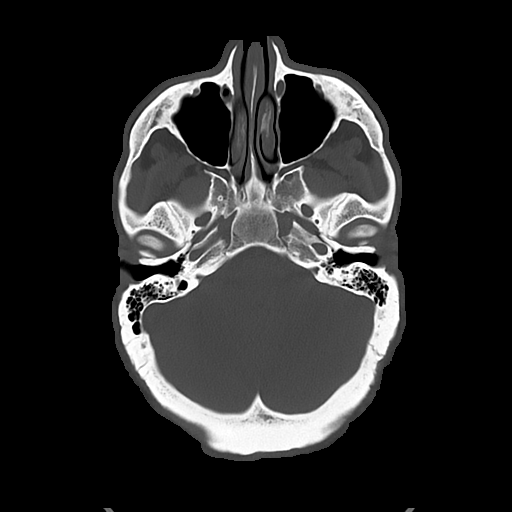
[im 25/82  bone]
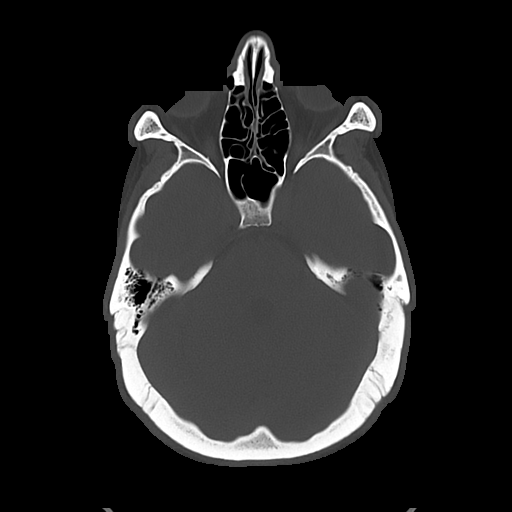

[Series 5: cor soft · coronal · 0.33mm/px · 3 of 76 slices shown]
[im 26/76  brain]
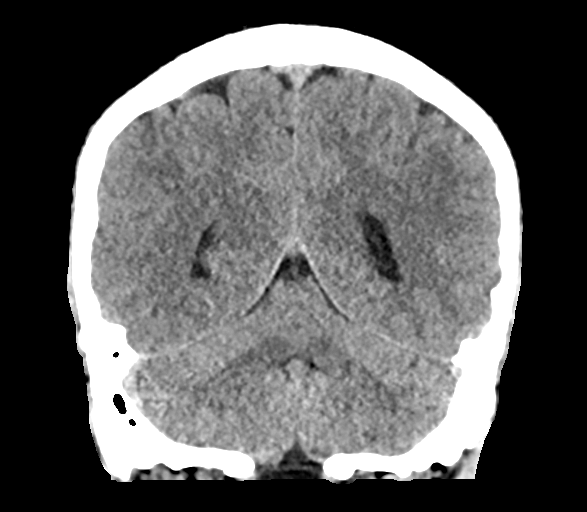
[im 34/76  brain]
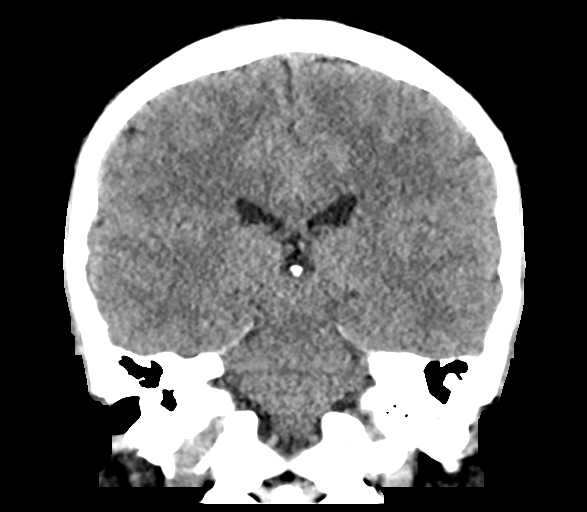
[im 42/76  brain]
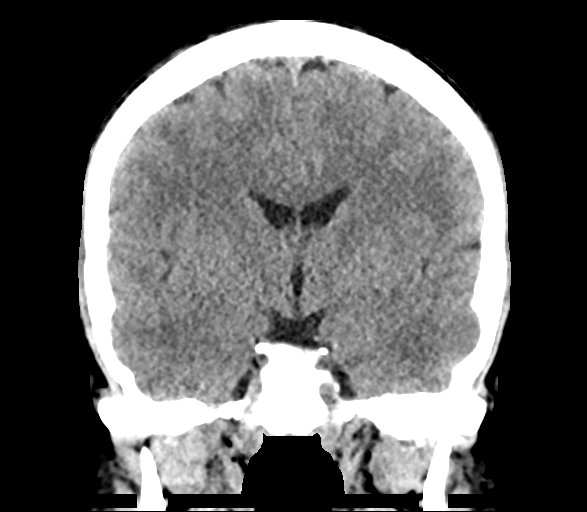

[Series 6: sag soft · sagittal · 0.33mm/px · 3 of 66 slices shown]
[im 22/66  brain]
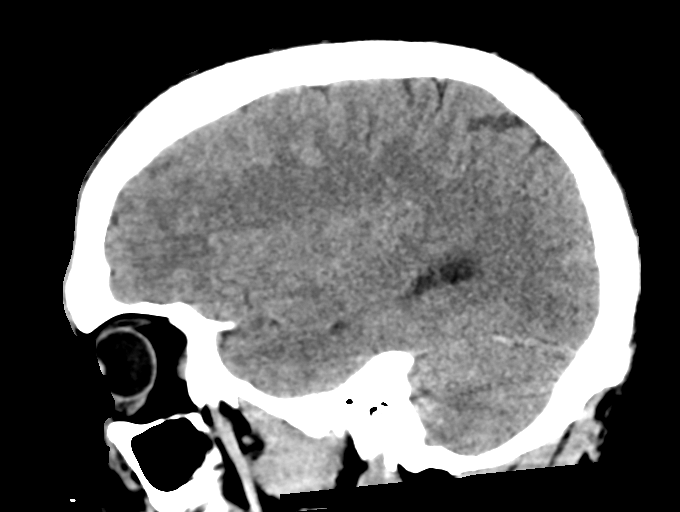
[im 33/66  brain]
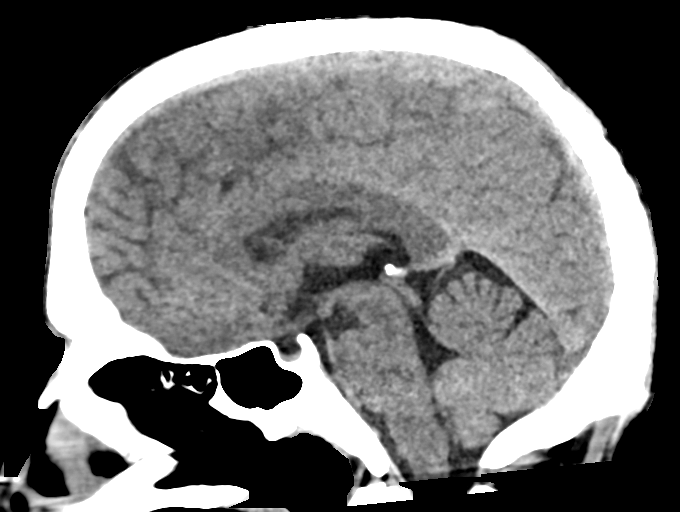
[im 44/66  brain]
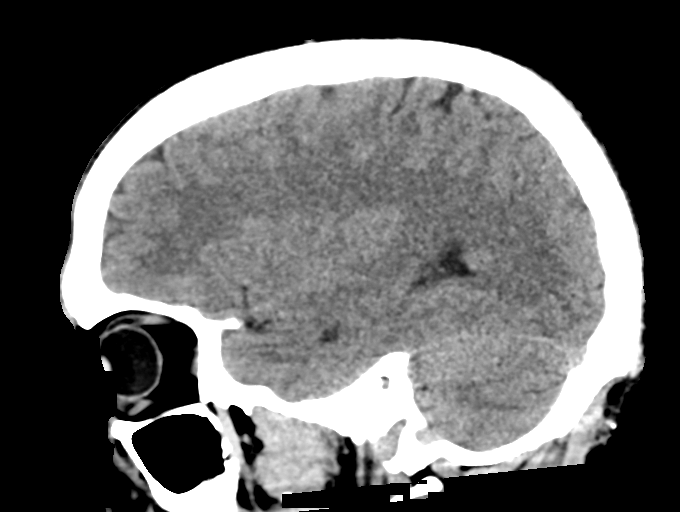

[16 of 47 positions shown; findings below may reference images not displayed]

FINDINGS: Brain: There is no mass, hemorrhage or extra-axial collection. The
size and configuration of the ventricles and extra-axial CSF spaces
are normal. The brain parenchyma is normal, without acute or chronic
infarction.

Vascular: No abnormal hyperdensity of the major intracranial
arteries or dural venous sinuses. No intracranial atherosclerosis.

Skull: The visualized skull base, calvarium and extracranial soft
tissues are normal.

Sinuses/Orbits: No fluid levels or advanced mucosal thickening of
the visualized paranasal sinuses. No mastoid or middle ear effusion.
The orbits are normal.
IMPRESSION: Normal head CT.

## 2022-01-04 DIAGNOSIS — R002 Palpitations: Secondary | ICD-10-CM | POA: Diagnosis not present

## 2022-01-04 DIAGNOSIS — R0782 Intercostal pain: Secondary | ICD-10-CM | POA: Diagnosis not present

## 2022-01-08 ENCOUNTER — Encounter (HOSPITAL_BASED_OUTPATIENT_CLINIC_OR_DEPARTMENT_OTHER): Payer: Self-pay | Admitting: Cardiology

## 2022-01-08 ENCOUNTER — Ambulatory Visit: Payer: BC Managed Care – PPO | Attending: Cardiology

## 2022-01-08 ENCOUNTER — Ambulatory Visit (HOSPITAL_BASED_OUTPATIENT_CLINIC_OR_DEPARTMENT_OTHER): Payer: BC Managed Care – PPO | Admitting: Cardiology

## 2022-01-08 VITALS — BP 120/74 | HR 105 | Ht 69.0 in | Wt 202.4 lb

## 2022-01-08 DIAGNOSIS — R002 Palpitations: Secondary | ICD-10-CM

## 2022-01-08 DIAGNOSIS — Z7189 Other specified counseling: Secondary | ICD-10-CM

## 2022-01-08 NOTE — Progress Notes (Unsigned)
Enrolled patient for a 14 day Zio XT  monitor to be mailed to patients home  °

## 2022-01-08 NOTE — Progress Notes (Unsigned)
Cardiology Office Note:    Date:  01/09/2022   ID:  Nicholas Hammond, DOB 1985/02/23, MRN [id number]  PCP:  Vivi Barrack, MD  Cardiologist:  Buford Dresser, MD  Referring MD: Vivi Barrack, MD   CC: follow up  History of Present Illness:    Nicholas Hammond is a 36 y.o. male with a hx of chest pain, syncope who is seen for follow up today. I initially met him 09/04/20 as a new consult at the request of Vivi Barrack, MD for the evaluation and management of chest pain.  Family history: Maternal grandfather died of heart attack at 39 yo. His sister has cardiac issues.  Today: Brings an ECG from 01-04-22 noting NSR at 82 bpm, possible LAE. Reviewed labs (CBC and BMET). [facility]. Last week noted fluttering/palpitations, felt an occasional pause when checking his pulse manually. Notices when he is sitting still/quiet, doesn't notice when he is active. Feels that it can last for hours, feels it every 20 minutes or so. Not chest pain but a weird sensation with it.   Has cut back on caffeine, down to 1-2 cans of Pepsi occasionally. Cutting back on nicotine as well (vapes).   Able to exercise routinely at the gym without limitations.   Denies chest pain, shortness of breath at rest or with normal exertion. No PND, orthopnea, LE edema or unexpected weight gain. No syncope.    Past Medical History:  Diagnosis Date   Allergy    History of chicken pox    Syncope and collapse     History reviewed. No pertinent surgical history.  Current Medications: Current Outpatient Medications on File Prior to Visit  Medication Sig   cyclobenzaprine (FLEXERIL) 10 MG tablet Take 1 tablet (10 mg total) by mouth 3 (three) times daily as needed for muscle spasms.   acetaminophen (TYLENOL) 500 MG tablet Take 500 mg by mouth as needed.   No current facility-administered medications on file prior to visit.     Allergies:   Influenza vaccines   Social  History   Tobacco Use   Smoking status: Former    Packs/day: 1.00    Years: 15.00    Total pack years: 15.00    Types: Cigarettes   Smokeless tobacco: Never  Vaping Use   Vaping Use: Every day  Substance Use Topics   Alcohol use: Yes    Comment: socially   Drug use: Not Currently    Comment: 1/4 tsp everyday    Family History: family history includes Arthritis in his sister; Cancer in his maternal grandmother; Crohn's disease in his brother, mother, and sister; Heart disease in his maternal grandfather, maternal grandmother, paternal grandfather, and sister; Rheum arthritis in his sister.  ROS:   Please see the history of present illness.  Additional pertinent ROS otherwise unremarkable.  EKGs/Labs/Other Studies Reviewed:    The following studies were reviewed today:  Echo 08/18/2020 1. Left ventricular ejection fraction, by estimation, is 60 to 65%. Left  ventricular ejection fraction by 3D volume is 66 %. The left ventricle has  normal function. The left ventricle has no regional wall motion  abnormalities. Left ventricular diastolic   parameters were normal.   2. Right ventricular systolic function is normal. The right ventricular  size is normal.   3. The mitral valve is normal in structure. No evidence of mitral valve  regurgitation. No evidence of mitral stenosis.   4. The aortic valve is  normal in structure. Aortic valve regurgitation is  not visualized. No aortic stenosis is present.   5. The inferior vena cava is normal in size with greater than 50%  respiratory variability, suggesting right atrial pressure of 3 mmHg.   EKG:  EKG is personally reviewed.   09/04/2020: NSR, nonspecific st pattern, 88 bpm  Recent Labs: No results found for requested labs within last 365 days.  Recent Lipid Panel    Component Value Date/Time   CHOL 191 04/04/2017 1027   TRIG 82.0 04/04/2017 1027   HDL 45.40 04/04/2017 1027   CHOLHDL 4 04/04/2017 1027   VLDL 16.4 04/04/2017  1027   LDLCALC 129 (H) 04/04/2017 1027    Physical Exam:    VS:  BP 120/74   Pulse (!) 105   Ht _0  (1.753 m)   Wt 202 lb 6.4 oz (91.8 kg)   SpO2 97%   BMI 29.89 kg/m     Wt Readings from Last 3 Encounters:  01/08/22 202 lb 6.4 oz (91.8 kg)  11/20/20 174 lb 9.6 oz (79.2 kg)  09/04/20 159 lb (72.1 kg)    GEN: Well nourished, well developed in no acute distress HEENT: Normal, moist mucous membranes NECK: No JVD CARDIAC: regular rhythm, normal S1 and S2, no rubs or gallops. No murmur. VASCULAR: Radial and DP pulses 2+ bilaterally. No carotid bruits RESPIRATORY:  Clear to auscultation without rales, wheezing or rhonchi  ABDOMEN: Soft, non-tender, non-distended MUSCULOSKELETAL:  Ambulates independently SKIN: Warm and dry, no edema NEUROLOGIC:  Alert and oriented x 3. No focal neuro deficits noted. PSYCHIATRIC:  Normal affect    ASSESSMENT:    1. Heart palpitations   2. Cardiac risk counseling   3. Counseling on health promotion and disease prevention     PLAN:    Palpitations -we discussed the potential causes of fast heart rates and palpitations today. Reviewed the normal electrical system of the heart. Reviewed the role of the sinus node. Reviewed the balance between resting (vagal) tone and fight or flight nervous system input. Reviewed how exercise improves vagal tone and lowers resting heart rate. Reviewed that sinus tachycardia, or elevated sinus rate, is usually secondary to something else in the body. This can include pain, stress, infection, anxiety, hormone imbalance, low blood counts, etc. Discussed that we do not typically treat sinus tachycardia by itself, and instead the focus is on finding what is driving the heart rate and treating that. We discussed that there can be other rhythm issues, from either the top or bottom of the heart, that are abnormal rhythms. Discussed how we evaluate for these.  -discussed event monitor. He is interested but would like to see  what the out of pocket will be first.  Cardiac risk counseling and prevention recommendations: -recommend heart healthy/Mediterranean diet, with whole grains, fruits, vegetable, fish, lean meats, nuts, and olive oil. Limit salt. -recommend moderate walking, 3-5 times/week for 30-50 minutes each session. Aim for at least 150 minutes.week. Goal should be pace of 3 miles/hours, or walking 1.5 miles in 30 minutes -recommend avoidance of tobacco products. Avoid excess alcohol. -ASCVD risk score: The ASCVD Risk score (Arnett DK, et al., 2019) failed to calculate for the following reasons:   The 2019 ASCVD risk score is only valid for ages 13 to 80    Plan for follow up: to be determined based on results of monitor  Buford Dresser, MD, PhD, [facility]    Medication Adjustments/Labs and Tests Ordered:  Current medicines are reviewed at length with the patient today.  Concerns regarding medicines are outlined above.  Orders Placed This Encounter  Procedures   LONG TERM MONITOR (3-14 DAYS)   No orders of the defined types were placed in this encounter.  Patient Instructions  Medication Instructions:  The current medical regimen is effective;  continue present plan and medications.  *If you need a refill on your cardiac medications before your next appointment, please call your pharmacy*  Testing/Procedures:  [place] Monitor Instructions  Your physician has requested you wear a ZIO patch monitor for 14 days.  This is a single patch monitor. [employer] supplies one patch monitor per enrollment. Additional stickers are not available. Please do not apply patch if you will be having a Nuclear Stress Test,  Echocardiogram, Cardiac CT, MRI, or Chest Xray during the period you would be wearing the  monitor. The patch cannot be worn during these tests. You cannot remove and re-apply the  ZIO XT patch monitor.  Your ZIO patch monitor will be mailed 3 day USPS to  your address on file. It may take 3-5 days  to receive your monitor after you have been enrolled.  Once you have received your monitor, please review the enclosed instructions. Your monitor  has already been registered assigning a specific monitor serial # to you.  Billing and Patient Assistance Program Information  We have supplied [employer] with any of your insurance information on file for billing purposes. [employer] offers a sliding scale Patient Assistance Program for patients that do not have  insurance, or whose insurance does not completely cover the cost of the ZIO monitor.  You must apply for the Patient Assistance Program to qualify for this discounted rate.  To apply, please call [employer] at [phone], select option 4, select option 2, ask to apply for  Patient Assistance Program. Theodore Demark will ask your household income, and how many people  are in your household. They will quote your out-of-pocket cost based on that information.  [employer] will also be able to set up a 39-month interest-free payment plan if needed.  Applying the monitor   Shave hair from upper left chest.  Hold abrader disc by orange tab. Rub abrader in 40 strokes over the upper left chest as  indicated in your monitor instructions.  Clean area with 4 enclosed alcohol pads. Let dry.  Apply patch as indicated in monitor instructions. Patch will be placed under collarbone on left  side of chest with arrow pointing upward.  Rub patch adhesive wings for 2 minutes. Remove white label marked "1". Remove the white  label marked "2". Rub patch adhesive wings for 2 additional minutes.  While looking in a mirror, press and release button in center of patch. A small green light will  flash 3-4 times. This will be your only indicator that the monitor has been turned on.  Do not shower for the first 24 hours. You may shower after the first 24 hours.  Press the button if you feel a symptom. You will hear a small click. Record  Date, Time and  Symptom in the Patient Logbook.  When you are ready to remove the patch, follow instructions on the last 2 pages of Patient  Logbook. Stick patch monitor onto the last page of Patient Logbook.  Place Patient Logbook in the blue and white box. Use locking tab on box and tape box closed  securely. The blue and white box has prepaid postage on  it. Please place it in the mailbox as  soon as possible. Your physician should have your test results approximately 7 days after the  monitor has been mailed back to [facility].  Call [facility] at [phone] if you have questions regarding  your ZIO XT patch monitor. Call them immediately if you see an orange light blinking on your  monitor.  If your monitor falls off in less than 4 days, contact our Monitor department at [phone].  If your monitor becomes loose or falls off after 4 days call [employer] at [phone] for  suggestions on securing your monitor    Follow-Up: At [facility], you and your health needs are our priority.  As part of our continuing mission to provide you with exceptional heart care, we have created designated Provider Care Teams.  These Care Teams include your primary Cardiologist (physician) and Advanced Practice Providers (APPs -  Physician Assistants and Nurse Practitioners) who all work together to provide you with the care you need, when you need it.  We recommend signing up for the patient portal called "MyChart".  Sign up information is provided on this After Visit Summary.  MyChart is used to connect with patients for Virtual Visits (Telemedicine).  Patients are able to view lab/test results, encounter notes, upcoming appointments, etc.  Non-urgent messages can be sent to your provider as well.   To learn more about what you can do with MyChart, go to [email or web address].    Your next appointment:   As needed  The format for your next appointment:   In  Person  Provider:   Buford Dresser, MD            Signed, Buford Dresser, MD PhD 01/09/2022     [place]

## 2022-01-08 NOTE — Patient Instructions (Signed)
Medication Instructions:  The current medical regimen is effective;  continue present plan and medications.  *If you need a refill on your cardiac medications before your next appointment, please call your pharmacy*  Testing/Procedures:  ZIO XT- Long Term Monitor Instructions  Your physician has requested you wear a ZIO patch monitor for 14 days.  This is a single patch monitor. [employer] supplies one patch monitor per enrollment. Additional stickers are not available. Please do not apply patch if you will be having a Nuclear Stress Test,  Echocardiogram, Cardiac CT, MRI, or Chest Xray during the period you would be wearing the  monitor. The patch cannot be worn during these tests. You cannot remove and re-apply the  ZIO XT patch monitor.  Your ZIO patch monitor will be mailed 3 day USPS to your address on file. It may take 3-5 days  to receive your monitor after you have been enrolled.  Once you have received your monitor, please review the enclosed instructions. Your monitor  has already been registered assigning a specific monitor serial # to you.  Billing and Patient Assistance Program Information  We have supplied [employer] with any of your insurance information on file for billing purposes. [employer] offers a sliding scale Patient Assistance Program for patients that do not have  insurance, or whose insurance does not completely cover the cost of the ZIO monitor.  You must apply for the Patient Assistance Program to qualify for this discounted rate.  To apply, please call [employer] at [phone], select option 4, select option 2, ask to apply for  Patient Assistance Program. Meredeth Ide will ask your household income, and how many people  are in your household. They will quote your out-of-pocket cost based on that information.  [employer] will also be able to set up a 25-month, interest-free payment plan if needed.  Applying the monitor   Shave hair from upper left chest.  Hold abrader  disc by orange tab. Rub abrader in 40 strokes over the upper left chest as  indicated in your monitor instructions.  Clean area with 4 enclosed alcohol pads. Let dry.  Apply patch as indicated in monitor instructions. Patch will be placed under collarbone on left  side of chest with arrow pointing upward.  Rub patch adhesive wings for 2 minutes. Remove white label marked "1". Remove the white  label marked "2". Rub patch adhesive wings for 2 additional minutes.  While looking in a mirror, press and release button in center of patch. A small green light will  flash 3-4 times. This will be your only indicator that the monitor has been turned on.  Do not shower for the first 24 hours. You may shower after the first 24 hours.  Press the button if you feel a symptom. You will hear a small click. Record Date, Time and  Symptom in the Patient Logbook.  When you are ready to remove the patch, follow instructions on the last 2 pages of Patient  Logbook. Stick patch monitor onto the last page of Patient Logbook.  Place Patient Logbook in the blue and white box. Use locking tab on box and tape box closed  securely. The blue and white box has prepaid postage on it. Please place it in the mailbox as  soon as possible. Your physician should have your test results approximately 7 days after the  monitor has been mailed back to [facility].  Call [facility] Customer Care at [phone] if you have questions regarding  your ZIO XT  patch monitor. Call them immediately if you see an orange light blinking on your  monitor.  If your monitor falls off in less than 4 days, contact our Monitor department at [phone].  If your monitor becomes loose or falls off after 4 days call [employer] at [phone] for  suggestions on securing your monitor    Follow-Up: At [facility], you and your health needs are our priority.  As part of our continuing mission to provide you with exceptional heart  care, we have created designated Provider Care Teams.  These Care Teams include your primary Cardiologist (physician) and Advanced Practice Providers (APPs -  Physician Assistants and Nurse Practitioners) who all work together to provide you with the care you need, when you need it.  We recommend signing up for the patient portal called "MyChart".  Sign up information is provided on this After Visit Summary.  MyChart is used to connect with patients for Virtual Visits (Telemedicine).  Patients are able to view lab/test results, encounter notes, upcoming appointments, etc.  Non-urgent messages can be sent to your provider as well.   To learn more about what you can do with MyChart, go to [email or web address].    Your next appointment:   As needed  The format for your next appointment:   In Person  Provider:   Jodelle Red, MD

## 2022-01-14 DIAGNOSIS — R002 Palpitations: Secondary | ICD-10-CM

## 2022-02-05 DIAGNOSIS — R002 Palpitations: Secondary | ICD-10-CM | POA: Diagnosis not present

## 2022-02-25 LAB — BASIC METABOLIC PANEL: Glucose: 88

## 2022-02-25 LAB — LIPID PANEL
Cholesterol: 212 — AB (ref 0–200)
HDL: 37 (ref 35–70)
LDL Cholesterol: 111
LDl/HDL Ratio: 5.7
Triglycerides: 372 — AB (ref 40–160)

## 2022-04-02 ENCOUNTER — Telehealth (HOSPITAL_BASED_OUTPATIENT_CLINIC_OR_DEPARTMENT_OTHER): Payer: Self-pay

## 2022-04-02 NOTE — Telephone Encounter (Addendum)
Left message for patient to call back    ----- Message from Loel Dubonnet, NP sent at 04/02/2022  4:56 PM EST ----- Monitor shows predominantly normal sinus rhythm.  There was evidence of second degree heart block during sleeping hours which can be a sign of sleep apnea.  Recommend sleep study.  Triggered episodes were associated with occasional early beat called a PAC which was happening less than 1% of the time and is not dangerous.  Recommend avoidance of alcohol and caffeine and that he stay well-hydrated to prevent palpitations.  Recommend follow up with Dr. Harrell Gave in a month or two to re-discuss symptoms.

## 2022-04-04 NOTE — Telephone Encounter (Signed)
  2nd call attempt, no answer, left message      ----- Message from Loel Dubonnet, NP sent at 04/02/2022  4:56 PM EST ----- Monitor shows predominantly normal sinus rhythm.  There was evidence of second degree heart block during sleeping hours which can be a sign of sleep apnea.  Recommend sleep study.  Triggered episodes were associated with occasional early beat called a PAC which was happening less than 1% of the time and is not dangerous.  Recommend avoidance of alcohol and caffeine and that he stay well-hydrated to prevent palpitations.   Recommend follow up with Dr. Harrell Gave in a month or two to re-discuss symptoms.

## 2022-04-05 NOTE — Telephone Encounter (Signed)
3rd call attempt, no answer, left message for patient to return call! Letter mailed to patient with results and to call office upon receipt.

## 2022-05-06 ENCOUNTER — Encounter: Payer: Self-pay | Admitting: Family Medicine

## 2022-05-06 ENCOUNTER — Ambulatory Visit: Payer: 59 | Admitting: Family Medicine

## 2022-05-06 VITALS — BP 107/71 | HR 80 | Temp 98.2°F | Ht 69.0 in | Wt 207.4 lb

## 2022-05-06 DIAGNOSIS — G5601 Carpal tunnel syndrome, right upper limb: Secondary | ICD-10-CM | POA: Diagnosis not present

## 2022-05-06 DIAGNOSIS — M7711 Lateral epicondylitis, right elbow: Secondary | ICD-10-CM

## 2022-05-06 MED ORDER — MELOXICAM 15 MG PO TABS: 15.0000 mg | ORAL_TABLET | Freq: Every day | ORAL | 0 refills | Status: DC

## 2022-05-06 NOTE — Patient Instructions (Signed)
It was very nice to see you today!  You have lateral epicondylitis and carpal tunnel syndrome.  Please work on the exercises.  Please use a tennis elbow strap and a cock up wrist splint.  Please take the meloxicam daily for the next 14 days.  You can use ice to the area as well.  Let us know if not improving.  Take care, Dr Jerline Pain  PLEASE NOTE:  If you had any lab tests, please let us know if you have not heard back within a few days. You may see your results on mychart before we have a chance to review them but we will give you a call once they are reviewed by [place].   If we ordered any referrals today, please let us know if you have not heard from their office within the next week.   If you had any urgent prescriptions sent in today, please check with the pharmacy within an hour of our visit to make sure the prescription was transmitted appropriately.   Please try these tips to maintain a healthy lifestyle:  Eat at least 3 REAL meals and 1-2 snacks per day.  Aim for no more than 5 hours between eating.  If you eat breakfast, please do so within one hour of getting up.   Each meal should contain half fruits/vegetables, one quarter protein, and one quarter carbs (no bigger than a computer mouse)  Cut down on sweet beverages. This includes juice, soda, and sweet tea.   Drink at least 1 glass of water with each meal and aim for at least 8 glasses per day  Exercise at least 150 minutes every week.

## 2022-05-06 NOTE — Progress Notes (Signed)
   Nicholas Hammond is a 37 y.o. male who presents today for an office visit.  Assessment/Plan:  Lateral epicondylitis No red flags.  We discussed treatment options.  Will start meloxicam 15 mg daily for 2 weeks.  Also discussed counterforce bracing and avoidance of aggravating activities.  Will give work note stating he should stay on light duty for a couple of weeks.  Home exercise program was discussed and handout was given.  He can also use ice to the area.  He will let me know if not improving and would consider referral to PT or sports medicine at that time.   Carpal tunnel syndrome No red flags.  Likely secondary to repetitive motion due to his job.  Will be starting meloxicam 15 mg daily as above.  Also discussed splinting.  Discussed home exercises and handout was given.  He will let me know if not improving and would consider referral to sports medicine at that time.    Subjective:  HPI:  Patient here with right elbow pain.  This started about 2 weeks ago. He injurid it while at work pulling cable through a wall. Sometimes gets numbness in right hand as well. Worse with certain motions. Minimal pain at rest. Tried stretchng, IcyHot, tylenol. None of this helped.  Symptoms may have slightly improved over last few days but not significantly.       Objective:  Physical Exam: BP 107/71   Pulse 80   Temp 98.2 F (36.8 C) (Temporal)   Ht 5\' 9"  (1.753 m)   Wt 207 lb 6.4 oz (94.1 kg)   SpO2 99%   BMI 30.63 kg/m   Gen: No acute distress, resting comfortably MSK: - Right Elbow: No deformities.  Very tender to palpation along lateral epicondyle.  Pain elicited with resisted extension of third and fourth digit. - Right Wrist: No deformities.  Positive Tinel sign.  Neurovascular intact distally. Neuro: Grossly normal, moves all extremities Psych: Normal affect and thought content      Yetunde Leis M. Jerline Pain, MD 05/06/2022 12:18 PM

## 2022-06-21 ENCOUNTER — Encounter: Payer: Self-pay | Admitting: Family Medicine

## 2022-06-21 ENCOUNTER — Ambulatory Visit: Payer: 59 | Admitting: Family Medicine

## 2022-06-21 VITALS — BP 117/76 | HR 82 | Temp 97.5°F | Ht 69.0 in | Wt 206.4 lb

## 2022-06-21 DIAGNOSIS — M7712 Lateral epicondylitis, left elbow: Secondary | ICD-10-CM | POA: Diagnosis not present

## 2022-06-21 DIAGNOSIS — M7711 Lateral epicondylitis, right elbow: Secondary | ICD-10-CM | POA: Diagnosis not present

## 2022-06-21 MED ORDER — NITROGLYCERIN 0.1 MG/HR TD PT24: MEDICATED_PATCH | TRANSDERMAL | 0 refills | Status: DC

## 2022-06-21 MED ORDER — DICLOFENAC SODIUM 75 MG PO TBEC: 75.0000 mg | DELAYED_RELEASE_TABLET | Freq: Two times a day (BID) | ORAL | 0 refills | Status: DC

## 2022-06-21 NOTE — Patient Instructions (Signed)
It was very nice to see you today!  You have tennis elbow.  Please try the diclofenac twice daily instead of the meloxicam.  Please place a quarter of a nitroglycerin patch to each elbow daily.  This will help promote blood flow and help with your pain levels.  I will refer you to see a physical therapist.  Return if symptoms worsen or fail to improve.   Take care, Dr Jimmey Ralph  PLEASE NOTE:  If you had any lab tests, please let us know if you have not heard back within a few days. You may see your results on mychart before we have a chance to review them but we will give you a call once they are reviewed by [place].   If we ordered any referrals today, please let us know if you have not heard from their office within the next week.   If you had any urgent prescriptions sent in today, please check with the pharmacy within an hour of our visit to make sure the prescription was transmitted appropriately.   Please try these tips to maintain a healthy lifestyle:  Eat at least 3 REAL meals and 1-2 snacks per day.  Aim for no more than 5 hours between eating.  If you eat breakfast, please do so within one hour of getting up.   Each meal should contain half fruits/vegetables, one quarter protein, and one quarter carbs (no bigger than a computer mouse)  Cut down on sweet beverages. This includes juice, soda, and sweet tea.   Drink at least 1 glass of water with each meal and aim for at least 8 glasses per day  Exercise at least 150 minutes every week.

## 2022-06-21 NOTE — Progress Notes (Signed)
   Nicholas Hammond is a 37 y.o. male who presents today for an office visit.  Assessment/Plan:  Lateral epicondylitis Has only had modest benefit with course of meloxicam and home exercise program.  We did discuss further treatment options.  He would like to avoid steroid injections for now.  We will try diclofenac to see if he does better with this than the meloxicam.  Will also refer to physical therapy.  We will start nitroglycerin patch protocol.  We discussed potential side effects.  He will continue with counterforce bracing and home exercise program until he can see the physical therapist.  He will let us know if not improving in the next several weeks.  Would consider steroid injection versus referral to sports medicine at that time if needed.    Subjective:  HPI:  Patient here with bilateral elbow pain.  He did see him about 6 weeks ago for right lateral epicondylitis.  We started him on meloxicam and we discussed home exercise program.  He has also been using counterforce bracing.  He did not feel like meloxicam is effective.  He did have some improvement with counterforce bracing however symptoms returned shortly after taking them off.  Symptoms are worse with his activities at work.  He has now started develop symptoms of left elbow as well.       Objective:  Physical Exam: BP 117/76   Pulse 82   Temp (!) 97.5 F (36.4 C) (Temporal)   Ht 5\' 9"  (1.753 m)   Wt 206 lb 6.4 oz (93.6 kg)   SpO2 99%   BMI 30.48 kg/m   Gen: No acute distress, resting comfortably MSK - Right Elbow: Very tender to palpation along right lateral epicondyle.  Minimal pain with resisted extension of third and fourth digit - Left elbow: No deformities.  Tenderness to palpation along lateral epicondyle.  Minimal pain with resisted extension of third and fourth digits. Neuro: Grossly normal, moves all extremities Psych: Normal affect and thought content      Hector Taft M. Jimmey Ralph, MD 06/21/2022 9:28 AM

## 2022-07-04 ENCOUNTER — Other Ambulatory Visit: Payer: Self-pay

## 2022-07-04 ENCOUNTER — Encounter: Payer: Self-pay | Admitting: Physical Therapy

## 2022-07-04 ENCOUNTER — Ambulatory Visit (INDEPENDENT_AMBULATORY_CARE_PROVIDER_SITE_OTHER): Payer: 59 | Admitting: Physical Therapy

## 2022-07-04 DIAGNOSIS — M25522 Pain in left elbow: Secondary | ICD-10-CM | POA: Diagnosis not present

## 2022-07-04 DIAGNOSIS — R29898 Other symptoms and signs involving the musculoskeletal system: Secondary | ICD-10-CM

## 2022-07-04 DIAGNOSIS — M25521 Pain in right elbow: Secondary | ICD-10-CM | POA: Diagnosis not present

## 2022-07-04 NOTE — Therapy (Signed)
OUTPATIENT PHYSICAL THERAPY ELBOW  EVALUATION   Patient Name: Nicholas Hammond MRN: [id number] DOB:[birth year], 37 y.o., male Today's Date: 07/04/2022  END OF SESSION:  PT End of Session - 07/04/22 1601     Visit Number 1    Number of Visits 9    Date for PT Re-Evaluation 08/01/22    Authorization Type [employer]    Authorization Time Period 07/04/22 to 08/01/22    PT Start Time 1515    PT Stop Time 1551   all goals of evaluation met   PT Time Calculation (min) 36 min    Activity Tolerance Patient tolerated treatment well    Behavior During Therapy [facility] for tasks assessed/performed             Past Medical History:  Diagnosis Date   Allergy    History of chicken pox    Syncope and collapse    History reviewed. No pertinent surgical history. Patient Active Problem List   Diagnosis Date Noted   Anxiety 08/31/2020   Depression, major, single episode, mild (HCC) 08/31/2020   Nicotine dependence with current use 04/04/2017   Frequent stools 04/01/2017   ABSCESS, TRUNK 06/30/2007   DEGENERATIVE JOINT DISEASE, BACK 06/30/2007    PCP: Ardith Dark, MD  REFERRING PROVIDER: Ardith Dark, MD  REFERRING DIAG: [phone] (ICD-10-CM) - Lateral epicondylitis of both elbows  THERAPY DIAG:  Pain in right elbow  Pain in left elbow  Other symptoms and signs involving the musculoskeletal system  Rationale for Evaluation and Treatment: Rehabilitation  ONSET DATE: March 2024  SUBJECTIVE:                                                                                                                                                                                      SUBJECTIVE STATEMENT: Started in March of this year, happened literally after 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 Trying nitroglycerin patches which help, also on a medication for inflammation/pain relief but not able to  take this due to side effects. In the mornings I tend to be super stiff. Did try braces for a little while, then switched to the tennis elbow braces which haven't done much.   Hand dominance: Ambidextrous  PERTINENT HISTORY: Syncope/collapse and seizure due to hypokalemia (cardio side cleared)  PAIN:  Are you having pain? Yes: NPRS scale: 3/10 Pain location: B elbows, R>L Pain description: annoying/nagging, sometimes sharp pain, can be sharp  Aggravating factors: repetitive motions, gripping cable Relieving factors: nitro patches   PRECAUTIONS: None  WEIGHT BEARING RESTRICTIONS: No  FALLS:  Has patient fallen in last 6 months? No  LIVING ENVIRONMENT: Lives with: lives with their spouse Lives in: House/apartment Stairs: 3 STE in front, 2 on the side, back 5 STE  Has following equipment at home: None  OCCUPATION: [employer]   PLOF: Independent, Independent with basic ADLs, Independent with gait, and Independent with transfers  PATIENT GOALS: be pain free  NEXT MD VISIT:   OBJECTIVE:     PATIENT SURVEYS:  FOTO 82, predicted 71 in 11 visits   COGNITION: Overall cognitive status: Within functional limits for tasks assessed     SENSATION: Not tested    UPPER EXTREMITY ROM:   Active ROM Right eval Left eval  Shoulder flexion    Shoulder extension    Shoulder abduction    Shoulder adduction    Shoulder internal rotation    Shoulder external rotation    Elbow flexion 130* 131*  Elbow extension -1* 0*  Wrist flexion WNL  WNL   Wrist extension WNL  WNL   Wrist ulnar deviation    Wrist radial deviation    Wrist pronation WNL  WNL   Wrist supination WNL  WNL   (Blank rows = not tested)  UPPER EXTREMITY MMT:  MMT Right eval Left eval  Shoulder flexion 4+ 4+  Shoulder extension    Shoulder abduction 4+ 4+  Shoulder adduction    Shoulder internal rotation    Shoulder external rotation    Middle trapezius    Lower trapezius    Elbow flexion 4+ 4+   Elbow extension 4+ pain  4+  Wrist flexion 4+ 4+  Wrist extension 4+ pain  4+  Wrist ulnar deviation    Wrist radial deviation    Wrist pronation    Wrist supination    Grip strength (lbs)    (Blank rows = not tested)      PALPATION:  Pain R UE above elbow, right above lateral epicondyle; R forearm muscles very tight but not TTP    TODAY'S TREATMENT:                                                                                                                                         DATE:   Eval  Objective measures, appropriate education, POC, HEP   TherEx  Eccentrics for wrist extensors x8 green TB Self massage to lateral elbow musculature Resistance twists for tennis elbow x10   PATIENT EDUCATION: Education details: exam findings, POC, HEP  Person educated: Patient Education method: Programmer, multimedia, Demonstration, and Handouts Education comprehension: verbalized understanding, returned demonstration, and needs further education  HOME EXERCISE PROGRAM: Access Code: [email or web address] URL: [email or web address] Date: 07/04/2022 Prepared by: Nedra Hai  Exercises - Eccentric Wrist Extension with Resistance  - 1 x daily - 7 x weekly - 3 sets - 10 reps - Tennis Elbow Self Massage  - 1 x daily - 7 x weekly -  3 sets - 10 reps - Resistance Bar Twist for Tennis Elbow  - 1 x daily - 7 x weekly - 3 sets - 10 reps  ASSESSMENT:  CLINICAL IMPRESSION: Patient is a 37 y.o. M who was seen today for physical therapy evaluation and treatment for B lateral epicondylitis. Exam reveals generalized pain around B lateral epicondyles, mild functional muscle weakness, and muscle spasms, all contributing to reduced functional use of UEs. Will benefit from skilled PT services to address pain and optimize overall level of function moving forward. Very motivated to improve and generally healthy, do not anticipate that long term plan of care will be necessary here.   OBJECTIVE  IMPAIRMENTS: decreased strength, increased fascial restrictions, impaired flexibility, impaired UE functional use, and pain.   ACTIVITY LIMITATIONS: carrying, lifting, reach over head, hygiene/grooming, and caring for others  PARTICIPATION LIMITATIONS: cleaning, community activity, occupation, and yard work  PERSONAL FACTORS: Age, Behavior pattern, Education, Fitness, and Time since onset of injury/illness/exacerbation are also affecting patient's functional outcome.   REHAB POTENTIAL: Excellent  CLINICAL DECISION MAKING: Stable/uncomplicated  EVALUATION COMPLEXITY: Low   GOALS: Goals reviewed with patient? Yes  SHORT TERM GOALS: Target date: 08/01/2022    Will be compliant with appropriate progressive HEP  Baseline: Goal status: INITIAL  2.  Pain to be no more than 2/10 at worst  Baseline:  Goal status: INITIAL  3.  Will be able to perform repetitive tasks at work with elbow pain no more than 2/10 at worst  Baseline:  Goal status: INITIAL  4. Will be independent with use of modalities such as ice massage and self-soft tissue massage to help manage pain Baseline:  Goal status: INITIAL  5.  FOTO score to have met goal level in 11 visits or less Baseline:  Goal status: INITIAL   LONG TERM GOALS: Target date: no LTGs appropriate at this point, will update if necessary at next re-assessment    PLAN:  PT FREQUENCY: 1-2x/week  PT DURATION: 4 weeks  PLANNED INTERVENTIONS: Therapeutic exercises, Therapeutic activity, Patient/Family education, Self Care, Joint mobilization, Dry Needling, Electrical stimulation, Cryotherapy, Moist heat, Taping, Ultrasound, Ionotophoresis 4mg /ml Dexamethasone, Manual therapy, and Re-evaluation  PLAN FOR NEXT SESSION: standard care for lateral epicondylitis within pain limits; could consider ionto but will need to clear this with MD as he is on nitro patches daily. Consider DN   Nedra Hai PT DPT PN2

## 2022-07-10 NOTE — Therapy (Unsigned)
OUTPATIENT PHYSICAL THERAPY ELBOW  TREATMENT   Patient Name: Nicholas Hammond MRN: [id number] DOB:[birth year], 37 y.o., male Today's Date: 07/11/2022  END OF SESSION:  PT End of Session - 07/11/22 1434     Visit Number 2    Number of Visits 9    Date for PT Re-Evaluation 08/01/22    Authorization Type [employer]    Authorization Time Period 07/04/22 to 08/01/22    PT Start Time 1434    PT Stop Time 1514    PT Time Calculation (min) 40 min    Activity Tolerance Patient tolerated treatment well    Behavior During Therapy [facility] for tasks assessed/performed              Past Medical History:  Diagnosis Date   Allergy    History of chicken pox    Syncope and collapse    History reviewed. No pertinent surgical history. Patient Active Problem List   Diagnosis Date Noted   Anxiety 08/31/2020   Depression, major, single episode, mild (HCC) 08/31/2020   Nicotine dependence with current use 04/04/2017   Frequent stools 04/01/2017   ABSCESS, TRUNK 06/30/2007   DEGENERATIVE JOINT DISEASE, BACK 06/30/2007    PCP: Ardith Dark, MD  REFERRING PROVIDER: Ardith Dark, MD  REFERRING DIAG: [phone] (ICD-10-CM) - Lateral epicondylitis of both elbows  THERAPY DIAG:  Pain in right elbow  Pain in left elbow  Rationale for Evaluation and Treatment: Rehabilitation  ONSET DATE: March 2024  SUBJECTIVE:                                                                                                                                                                                      SUBJECTIVE STATEMENT: 07/11/2022 States that he didn't use his nitro patches. States he is feeling pretty good today. Worse in the morning. Took an 8 hour tylenol and that might help.  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 Trying nitroglycerin patches  which help, also on a medication for inflammation/pain relief but not able to take this due to side effects. In the mornings I tend to be super stiff. Did try braces for a little while, then switched to the tennis elbow braces which haven't done much.   Hand dominance: Ambidextrous  PERTINENT HISTORY: Syncope/collapse and seizure due to hypokalemia (cardio side cleared)  PAIN:  Are you having pain? Yes: NPRS scale: 1-2/10 Pain location: B elbows, R>L Pain description: annoying/nagging, sometimes sharp pain, can be sharp  Aggravating factors: repetitive  motions, gripping cable Relieving factors: nitro patches   PRECAUTIONS: None  WEIGHT BEARING RESTRICTIONS: No  FALLS:  Has patient fallen in last 6 months? No  LIVING ENVIRONMENT: Lives with: lives with their spouse Lives in: House/apartment Stairs: 3 STE in front, 2 on the side, back 5 STE  Has following equipment at home: None  OCCUPATION: [employer]   PLOF: Independent, Independent with basic ADLs, Independent with gait, and Independent with transfers  PATIENT GOALS: be pain free  NEXT MD VISIT:   OBJECTIVE:     PATIENT SURVEYS:  FOTO 63, predicted 71 in 11 visits   COGNITION: Overall cognitive status: Within functional limits for tasks assessed     SENSATION: Not tested    UPPER EXTREMITY ROM:   Active ROM Right eval Left eval  Shoulder flexion    Shoulder extension    Shoulder abduction    Shoulder adduction    Shoulder internal rotation    Shoulder external rotation    Elbow flexion 130* 131*  Elbow extension -1* 0*  Wrist flexion WNL  WNL   Wrist extension WNL  WNL   Wrist ulnar deviation    Wrist radial deviation    Wrist pronation WNL  WNL   Wrist supination WNL  WNL   (Blank rows = not tested)  UPPER EXTREMITY MMT:  MMT Right eval Left eval  Shoulder flexion 4+ 4+  Shoulder extension    Shoulder abduction 4+ 4+  Shoulder adduction    Shoulder internal rotation    Shoulder  external rotation    Middle trapezius    Lower trapezius    Elbow flexion 4+ 4+  Elbow extension 4+ pain  4+  Wrist flexion 4+ 4+  Wrist extension 4+ pain  4+  Wrist ulnar deviation    Wrist radial deviation    Wrist pronation    Wrist supination    Grip strength (lbs)    (Blank rows = not tested)      PALPATION:  Pain R UE above elbow, right above lateral epicondyle; R forearm muscles very tight but not TTP    TODAY'S TREATMENT:                                                                                                                                         DATE:   Eval  Objective measures, appropriate education, POC, HEP   TherEx S/l book stretch x20 B10" holds Hand yoga in semi quad- circles, rocking forward/backwards/laterally and spalying fingers 30 minutes total  Thread the needle x10 10" holds B   PATIENT EDUCATION: Education details:  HEP, anatomy, rationale behind each exercise Person educated: Patient Education method: Explanation, Demonstration, and Handouts Education comprehension: verbalized understanding, returned demonstration, and needs further education  HOME EXERCISE PROGRAM: Access Code: [email or web address] URL: [email or web address] Date: 07/04/2022 Prepared by: Nedra Hai    ASSESSMENT:  CLINICAL IMPRESSION: 07/11/2022 Session focused  on education of anatomy and rationale behind each exercise.  Discussed importance of thoracic and lumbar mobility in regards to reducing strain and stress in upper extremities.  Tolerated exercises well and instructed patient to stay in pain-free range of motion as well as mid to moderate intensity of stretches.  Pain completely resolved after initial quadruped exercise with finger splaying and rocking side-to-side.  Instructed patient to perform this first thing in the morning as well as thoracic rotation exercises. Overall patient doing very well and would conitnue to benefit from skilled PT at this  time.   Eval: Patient is a 37 y.o. M who was seen today for physical therapy evaluation and treatment for B lateral epicondylitis. Exam reveals generalized pain around B lateral epicondyles, mild functional muscle weakness, and muscle spasms, all contributing to reduced functional use of UEs. Will benefit from skilled PT services to address pain and optimize overall level of function moving forward. Very motivated to improve and generally healthy, do not anticipate that long term plan of care will be necessary here.   OBJECTIVE IMPAIRMENTS: decreased strength, increased fascial restrictions, impaired flexibility, impaired UE functional use, and pain.   ACTIVITY LIMITATIONS: carrying, lifting, reach over head, hygiene/grooming, and caring for others  PARTICIPATION LIMITATIONS: cleaning, community activity, occupation, and yard work  PERSONAL FACTORS: Age, Behavior pattern, Education, Fitness, and Time since onset of injury/illness/exacerbation are also affecting patient's functional outcome.   REHAB POTENTIAL: Excellent  CLINICAL DECISION MAKING: Stable/uncomplicated  EVALUATION COMPLEXITY: Low   GOALS: Goals reviewed with patient? Yes  SHORT TERM GOALS: Target date: 08/01/2022    Will be compliant with appropriate progressive HEP  Baseline: Goal status: INITIAL  2.  Pain to be no more than 2/10 at worst  Baseline:  Goal status: INITIAL  3.  Will be able to perform repetitive tasks at work with elbow pain no more than 2/10 at worst  Baseline:  Goal status: INITIAL  4. Will be independent with use of modalities such as ice massage and self-soft tissue massage to help manage pain Baseline:  Goal status: INITIAL  5.  FOTO score to have met goal level in 11 visits or less Baseline:  Goal status: INITIAL   LONG TERM GOALS: Target date: no LTGs appropriate at this point, will update if necessary at next re-assessment    PLAN:  PT FREQUENCY: 1-2x/week  PT DURATION: 4  weeks  PLANNED INTERVENTIONS: Therapeutic exercises, Therapeutic activity, Patient/Family education, Self Care, Joint mobilization, Dry Needling, Electrical stimulation, Cryotherapy, Moist heat, Taping, Ultrasound, Ionotophoresis 4mg /ml Dexamethasone, Manual therapy, and Re-evaluation  PLAN FOR NEXT SESSION: standard care for lateral epicondylitis within pain limits; could consider ionto but will need to clear this with MD as he is on nitro patches daily. Consider DN   3:19 PM, 07/11/22 Tereasa Coop, DPT Physical Therapy with [facility]

## 2022-07-11 ENCOUNTER — Ambulatory Visit (INDEPENDENT_AMBULATORY_CARE_PROVIDER_SITE_OTHER): Payer: 59 | Admitting: Physical Therapy

## 2022-07-11 ENCOUNTER — Encounter: Payer: Self-pay | Admitting: Physical Therapy

## 2022-07-11 DIAGNOSIS — M25521 Pain in right elbow: Secondary | ICD-10-CM

## 2022-07-11 DIAGNOSIS — M25522 Pain in left elbow: Secondary | ICD-10-CM

## 2022-07-25 ENCOUNTER — Encounter: Payer: 59 | Admitting: Physical Therapy

## 2022-08-01 ENCOUNTER — Encounter: Payer: Self-pay | Admitting: Physical Therapy

## 2022-08-01 ENCOUNTER — Ambulatory Visit (INDEPENDENT_AMBULATORY_CARE_PROVIDER_SITE_OTHER): Payer: 59 | Admitting: Physical Therapy

## 2022-08-01 DIAGNOSIS — M25522 Pain in left elbow: Secondary | ICD-10-CM

## 2022-08-01 DIAGNOSIS — M25521 Pain in right elbow: Secondary | ICD-10-CM | POA: Diagnosis not present

## 2022-08-01 NOTE — Therapy (Signed)
OUTPATIENT PHYSICAL THERAPY ELBOW  TREATMENT   Patient Name: Nicholas Hammond MRN: [id number] DOB:10-09-1985, 37 y.o., male Today's Date: 08/01/2022  END OF SESSION:  PT End of Session - 08/01/22 1457     Visit Number 3    Number of Visits 9    Date for PT Re-Evaluation 08/01/22    Authorization Type [employer]    Authorization Time Period 07/04/22 to 08/01/22    PT Start Time 1500    PT Stop Time 1540    PT Time Calculation (min) 40 min    Activity Tolerance Patient tolerated treatment well    Behavior During Therapy [facility] for tasks assessed/performed              Past Medical History:  Diagnosis Date   Allergy    History of chicken pox    Syncope and collapse    History reviewed. No pertinent surgical history. Patient Active Problem List   Diagnosis Date Noted   Anxiety 08/31/2020   Depression, major, single episode, mild (HCC) 08/31/2020   Nicotine dependence with current use 04/04/2017   Frequent stools 04/01/2017   ABSCESS, TRUNK 06/30/2007   DEGENERATIVE JOINT DISEASE, BACK 06/30/2007    PCP: Ardith Dark, MD  REFERRING PROVIDER: Ardith Dark, MD  REFERRING DIAG: [phone] (ICD-10-CM) - Lateral epicondylitis of both elbows  THERAPY DIAG:  Pain in right elbow  Pain in left elbow  Rationale for Evaluation and Treatment: Rehabilitation  ONSET DATE: March 2024  SUBJECTIVE:                                                                                                                                                                                      SUBJECTIVE STATEMENT: 08/01/2022 States that he feels great and hasn't had to take his medication and only slight cramping with close body movements with wires.  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 Trying nitroglycerin patches which help, also on a medication for inflammation/pain relief but not able to take this due to side effects. In the mornings I tend to be super stiff. Did try braces for a little while, then switched to the tennis elbow braces which haven't done much.   Hand dominance: Ambidextrous  PERTINENT HISTORY: Syncope/collapse and seizure due to hypokalemia (cardio side cleared)  PAIN:  Are you having pain? Yes: NPRS scale: 1-2/10 Pain location: B elbows, R>L Pain description: annoying/nagging, sometimes sharp pain,  can be sharp  Aggravating factors: repetitive motions, gripping cable Relieving factors: nitro patches   PRECAUTIONS: None  WEIGHT BEARING RESTRICTIONS: No  FALLS:  Has patient fallen in last 6 months? No  LIVING ENVIRONMENT: Lives with: lives with their spouse Lives in: House/apartment Stairs: 3 STE in front, 2 on the side, back 5 STE  Has following equipment at home: None  OCCUPATION: [employer]   PLOF: Independent, Independent with basic ADLs, Independent with gait, and Independent with transfers  PATIENT GOALS: be pain free  NEXT MD VISIT:   OBJECTIVE:     PATIENT SURVEYS:  FOTO 73, predicted 71 in 11 visits   COGNITION: Overall cognitive status: Within functional limits for tasks assessed     SENSATION: Not tested    UPPER EXTREMITY ROM:   Active ROM Right eval Left eval  Shoulder flexion    Shoulder extension    Shoulder abduction    Shoulder adduction    Shoulder internal rotation    Shoulder external rotation    Elbow flexion 130* 131*  Elbow extension -1* 0*  Wrist flexion WNL  WNL   Wrist extension WNL  WNL   Wrist ulnar deviation    Wrist radial deviation    Wrist pronation WNL  WNL   Wrist supination WNL  WNL   (Blank rows = not tested)  UPPER EXTREMITY MMT:  MMT Right eval Left eval  Shoulder flexion 4+ 4+  Shoulder extension    Shoulder abduction 4+ 4+  Shoulder adduction     Shoulder internal rotation    Shoulder external rotation    Middle trapezius    Lower trapezius    Elbow flexion 4+ 4+  Elbow extension 4+ pain  4+  Wrist flexion 4+ 4+  Wrist extension 4+ pain  4+  Wrist ulnar deviation    Wrist radial deviation    Wrist pronation    Wrist supination    Grip strength (lbs)    (Blank rows = not tested)      PALPATION:  Pain R UE above elbow, right above lateral epicondyle; R forearm muscles very tight but not TTP    TODAY'S TREATMENT:                                                                                                                                         DATE:   08/01/2022  Neuro: Long exhale breathing- 6 minutes with tactile and verbal cues Posterior lateral rib expansion with inhale- theraband around lower ribs for tactile cues/support - 6 minutes   TherEx S/l book stretch x10 B10" holds Hand yoga in semi quad- circles, rocking forward/backwards/laterally and spalying fingers 5 minutes Thread the needle x10 10" holds B LTR x10 B 10" hlds Piriformis stretch IR and ER x2 B 15" holds DKC x5 10" holds SKC one leg stretch x5 10" hlds   PATIENT EDUCATION: Education details:  HEP, anatomy, rationale behind each exercise, importance of breathing exercises, core activation and improvement in posture/intra abdominal pressure Person educated: Patient Education method: Explanation, Demonstration, and Handouts Education comprehension: verbalized understanding, returned demonstration, and needs further education  HOME EXERCISE PROGRAM: Access Code: [email or web address] URL: [email or web address] Date: 07/04/2022 Prepared by: Nedra Hai    ASSESSMENT:  CLINICAL IMPRESSION: 08/01/2022 Significant improvement since last session and improved range of motion in the wrists noted with exercises from last session.  Discussed current presentation as well as plan moving forward.  Focused on lumbar and hip mobility today as well  as breathing exercises to continue to improve Lat length, core activation with breathing, and overall are rib expansion.  Tolerated all of this well and added new exercises to home program.  Will continue with current plan of care to to continue to develop generalized mobility routine to continue to work on elbow pain but also trunk stability and flexibility.  Eval: Patient is a 37 y.o. M who was seen today for physical therapy evaluation and treatment for B lateral epicondylitis. Exam reveals generalized pain around B lateral epicondyles, mild functional muscle weakness, and muscle spasms, all contributing to reduced functional use of UEs. Will benefit from skilled PT services to address pain and optimize overall level of function moving forward. Very motivated to improve and generally healthy, do not anticipate that long term plan of care will be necessary here.   OBJECTIVE IMPAIRMENTS: decreased strength, increased fascial restrictions, impaired flexibility, impaired UE functional use, and pain.   ACTIVITY LIMITATIONS: carrying, lifting, reach over head, hygiene/grooming, and caring for others  PARTICIPATION LIMITATIONS: cleaning, community activity, occupation, and yard work  PERSONAL FACTORS: Age, Behavior pattern, Education, Fitness, and Time since onset of injury/illness/exacerbation are also affecting patient's functional outcome.   REHAB POTENTIAL: Excellent  CLINICAL DECISION MAKING: Stable/uncomplicated  EVALUATION COMPLEXITY: Low   GOALS: Goals reviewed with patient? Yes  SHORT TERM GOALS: Target date: 08/01/2022    Will be compliant with appropriate progressive HEP  Baseline: Goal status: INITIAL  2.  Pain to be no more than 2/10 at worst  Baseline:  Goal status: INITIAL  3.  Will be able to perform repetitive tasks at work with elbow pain no more than 2/10 at worst  Baseline:  Goal status: INITIAL  4. Will be independent with use of modalities such as ice massage and  self-soft tissue massage to help manage pain Baseline:  Goal status: INITIAL  5.  FOTO score to have met goal level in 11 visits or less Baseline:  Goal status: INITIAL   LONG TERM GOALS: Target date: no LTGs appropriate at this point, will update if necessary at next re-assessment    PLAN:  PT FREQUENCY: 1-2x/week  PT DURATION: 4 weeks  PLANNED INTERVENTIONS: Therapeutic exercises, Therapeutic activity, Patient/Family education, Self Care, Joint mobilization, Dry Needling, Electrical stimulation, Cryotherapy, Moist heat, Taping, Ultrasound, Ionotophoresis 4mg /ml Dexamethasone, Manual therapy, and Re-evaluation  PLAN FOR NEXT SESSION: Continue with thoracic/lumbar mobility and stability, follow-up with breath work and advance as able, scapular protraction and field goal position at wall and shoulder external rotation for latissimus lengthening   3:49 PM, 08/01/22 Tereasa Coop, DPT Physical Therapy with [facility]

## 2022-08-08 ENCOUNTER — Encounter: Payer: 59 | Admitting: Physical Therapy

## 2022-08-15 ENCOUNTER — Encounter: Payer: 59 | Admitting: Physical Therapy

## 2022-08-29 ENCOUNTER — Ambulatory Visit (INDEPENDENT_AMBULATORY_CARE_PROVIDER_SITE_OTHER): Payer: 59 | Admitting: Physical Therapy

## 2022-08-29 ENCOUNTER — Encounter: Payer: Self-pay | Admitting: Physical Therapy

## 2022-08-29 DIAGNOSIS — M25522 Pain in left elbow: Secondary | ICD-10-CM | POA: Diagnosis not present

## 2022-08-29 DIAGNOSIS — M25521 Pain in right elbow: Secondary | ICD-10-CM | POA: Diagnosis not present

## 2022-08-29 NOTE — Therapy (Signed)
OUTPATIENT PHYSICAL THERAPY ELBOW  TREATMENT   Patient Name: Nicholas Hammond MRN: [id number] DOB:[birth year], 37 y.o., male Today's Date: 08/29/2022  END OF SESSION:  PT End of Session - 08/29/22 1522     Visit Number 4    Number of Visits 9    Date for PT Re-Evaluation 08/01/22    Authorization Type [employer]    Authorization Time Period 07/04/22 to 08/01/22    PT Start Time 1522    PT Stop Time 1600    PT Time Calculation (min) 38 min    Activity Tolerance Patient tolerated treatment well    Behavior During Therapy [facility] for tasks assessed/performed              Past Medical History:  Diagnosis Date   Allergy    History of chicken pox    Syncope and collapse    History reviewed. No pertinent surgical history. Patient Active Problem List   Diagnosis Date Noted   Anxiety 08/31/2020   Depression, major, single episode, mild (HCC) 08/31/2020   Nicotine dependence with current use 04/04/2017   Frequent stools 04/01/2017   ABSCESS, TRUNK 06/30/2007   DEGENERATIVE JOINT DISEASE, BACK 06/30/2007    PCP: Ardith Dark, MD  REFERRING PROVIDER: Ardith Dark, MD  REFERRING DIAG: [phone] (ICD-10-CM) - Lateral epicondylitis of both elbows  THERAPY DIAG:  Pain in right elbow  Pain in left elbow  Rationale for Evaluation and Treatment: Rehabilitation  ONSET DATE: March 2024  SUBJECTIVE:                                                                                                                                                                                      SUBJECTIVE STATEMENT: 08/29/2022 States that his elbow has been pain free. 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 Trying nitroglycerin patches which help, also  on a medication for inflammation/pain relief but not able to take this due to side effects. In the mornings I tend to be super stiff. Did try braces for a little while, then switched to the tennis elbow braces which haven't done much.   Hand dominance: Ambidextrous  PERTINENT HISTORY: Syncope/collapse and seizure due to hypokalemia (cardio side cleared)  PAIN:  Are you having pain? Yes: NPRS scale: 0/10 Pain location: B elbows, R>L Pain description: annoying/nagging, sometimes sharp pain, can be sharp  Aggravating factors: repetitive motions,  gripping cable Relieving factors: nitro patches   PRECAUTIONS: None  WEIGHT BEARING RESTRICTIONS: No  FALLS:  Has patient fallen in last 6 months? No  LIVING ENVIRONMENT: Lives with: lives with their spouse Lives in: House/apartment Stairs: 3 STE in front, 2 on the side, back 5 STE  Has following equipment at home: None  OCCUPATION: Commercial AV   PLOF: Independent, Independent with basic ADLs, Independent with gait, and Independent with transfers  PATIENT GOALS: be pain free  NEXT MD VISIT:   OBJECTIVE:     PATIENT SURVEYS:  FOTO 57, predicted 71 in 11 visits  82.6.   COGNITION: Overall cognitive status: Within functional limits for tasks assessed     SENSATION: Not tested    UPPER EXTREMITY ROM:   Active ROM Right eval Left eval  Shoulder flexion    Shoulder extension    Shoulder abduction    Shoulder adduction    Shoulder internal rotation    Shoulder external rotation    Elbow flexion 130* 131*  Elbow extension -1* 0*  Wrist flexion WNL  WNL   Wrist extension WNL  WNL   Wrist ulnar deviation    Wrist radial deviation    Wrist pronation WNL  WNL   Wrist supination WNL  WNL   (Blank rows = not tested)  UPPER EXTREMITY MMT:  MMT Right 7/11 Left 7/11  Shoulder flexion 4+ 4+  Shoulder extension    Shoulder abduction 4+ 4+  Shoulder adduction    Shoulder internal rotation    Shoulder external  rotation    Middle trapezius    Lower trapezius    Elbow flexion 5 5  Elbow extension 5 5  Wrist flexion 5 5  Wrist extension 5  5  Wrist ulnar deviation    Wrist radial deviation    Wrist pronation    Wrist supination    Grip strength (lbs)    (Blank rows = not tested)      PALPATION:     TODAY'S TREATMENT:                                                                                                                                         DATE:   08/29/2022   Hand yoga in quad- circles, rocking forward/backwards/laterally and spalying fingers 5 minutes Thread the needle x10 10" holds B LTR x10 B 10" hlds Piriformis stretch ER x3 B 15" holds Standing Pec/doorway stretch 30 sec x 4;  Scap squeeze 2 x 10 Shoulder ER ROM x 15,  Strength with RTB x 15;  Reviewed elbow extensor stretches for HEP.     PATIENT EDUCATION: Education details:  reviewed HEP and body mechanics for work duties  Person educated: Patient Education method: Programmer, multimedia, Facilities manager, and Handouts Education comprehension: verbalized understanding, returned demonstration, and needs further education  HOME EXERCISE PROGRAM: Access Code: [email or web address]    ASSESSMENT:  CLINICAL  IMPRESSION: 08/29/2022 Pt has made good progress. He has had no pain. He is doing great with HEP and adding in mobility during the work day. We reviewed optimal body mechanics for job duties. Pt has met all goals at this time, and is ready for d/c to HEP. Pt in agreement with plan.   Eval: Patient is a 37 y.o. M who was seen today for physical therapy evaluation and treatment for B lateral epicondylitis. Exam reveals generalized pain around B lateral epicondyles, mild functional muscle weakness, and muscle spasms, all contributing to reduced functional use of UEs. Will benefit from skilled PT services to address pain and optimize overall level of function moving forward. Very motivated to improve and generally healthy, do not  anticipate that long term plan of care will be necessary here.   OBJECTIVE IMPAIRMENTS: decreased strength, increased fascial restrictions, impaired flexibility, impaired UE functional use, and pain.   ACTIVITY LIMITATIONS: carrying, lifting, reach over head, hygiene/grooming, and caring for others  PARTICIPATION LIMITATIONS: cleaning, community activity, occupation, and yard work  PERSONAL FACTORS: Age, Behavior pattern, Education, Fitness, and Time since onset of injury/illness/exacerbation are also affecting patient's functional outcome.   REHAB POTENTIAL: Excellent  CLINICAL DECISION MAKING: Stable/uncomplicated  EVALUATION COMPLEXITY: Low   GOALS: Goals reviewed with patient? Yes  SHORT TERM GOALS: Target date: 08/01/2022    Will be compliant with appropriate progressive HEP  Baseline: Goal status: MET  2.  Pain to be no more than 2/10 at worst  Baseline:  Goal status: MET  3.  Will be able to perform repetitive tasks at work with elbow pain no more than 2/10 at worst  Baseline:  Goal status: MET  4. Will be independent with use of modalities such as ice massage and self-soft tissue massage to help manage pain Baseline:  Goal status: MET  5.  FOTO score to have met goal level in 11 visits or less Baseline:  Goal status: MET   LONG TERM GOALS: Target date: no LTGs appropriate at this point, will update if necessary at next re-assessment    PLAN:  PT FREQUENCY: 1-2x/week  PT DURATION: 4 weeks  PLANNED INTERVENTIONS: Therapeutic exercises, Therapeutic activity, Patient/Family education, Self Care, Joint mobilization, Dry Needling, Electrical stimulation, Cryotherapy, Moist heat, Taping, Ultrasound, Ionotophoresis 4mg /ml Dexamethasone, Manual therapy, and Re-evaluation  PLAN FOR NEXT SESSION: Continue with thoracic/lumbar mobility and stability, follow-up with breath work and advance as able, scapular protraction and field goal position at wall and shoulder  external rotation for latissimus lengthening  Sedalia Muta, PT, DPT 7:11 PM  08/29/22  PHYSICAL THERAPY DISCHARGE SUMMARY  Visits from Start of Care:4 Plan: Patient agrees to discharge.  Patient goals were  met. Patient is being discharged due to meeting the stated rehab goals.     Sedalia Muta, PT, DPT 7:33 PM  08/29/22

## 2023-03-04 ENCOUNTER — Encounter: Payer: Self-pay | Admitting: Family Medicine

## 2023-03-04 ENCOUNTER — Ambulatory Visit: Payer: 59 | Admitting: Family Medicine

## 2023-03-04 VITALS — BP 113/77 | HR 101 | Temp 99.0°F | Ht 69.0 in | Wt 210.4 lb

## 2023-03-04 DIAGNOSIS — E785 Hyperlipidemia, unspecified: Secondary | ICD-10-CM | POA: Diagnosis not present

## 2023-03-04 NOTE — Assessment & Plan Note (Signed)
 Patient recent lipid panel showed HDL 37, LDL 111, and triglycerides 372.  We discussed lifestyle interventions.  We can recheck in 6 to 12 months.

## 2023-03-04 NOTE — Patient Instructions (Signed)
 It was very nice to see you today!  Your cholesterol is mildly elevated.  Please continue to work on diet and exercise.  We should recheck again in a year or so.  Please keep an eye on the rash and let me know if this does not resolve in the next few days.  Return for Annual Physical.   Take care, Dr Kennyth  PLEASE NOTE:  If you had any lab tests, please let us  know if you have not heard back within a few days. You may see your results on mychart before we have a chance to review them but we will give you a call once they are reviewed by us .   If we ordered any referrals today, please let us  know if you have not heard from their office within the next week.   If you had any urgent prescriptions sent in today, please check with the pharmacy within an hour of our visit to make sure the prescription was transmitted appropriately.   Please try these tips to maintain a healthy lifestyle:  Eat at least 3 REAL meals and 1-2 snacks per day.  Aim for no more than 5 hours between eating.  If you eat breakfast, please do so within one hour of getting up.   Each meal should contain half fruits/vegetables, one quarter protein, and one quarter carbs (no bigger than a computer mouse)  Cut down on sweet beverages. This includes juice, soda, and sweet tea.   Drink at least 1 glass of water with each meal and aim for at least 8 glasses per day  Exercise at least 150 minutes every week.

## 2023-03-04 NOTE — Progress Notes (Signed)
   Nicholas Hammond is a 38 y.o. male who presents today for an office visit.  Assessment/Plan:  New/Acute Problems: Rash  Consistent with mild abrasion.  Appearance and history are not consistent with HSV however this is still in the differential.  Did discuss it may be inflamed genital wart however he has no prior history of genital warts either. Does not appear to be a chancre. He is in a monogamous relationship.  We will proceed with watchful waiting.  He will let us  know if this does not resolve in the next few days or if he has any recurrence in the future.  If he does develop rash more consistent with classic herpes in clinic vesicular rash we can obtain HSV titers or swab at that time.   Chronic Problems Addressed Today: Dyslipidemia Patient recent lipid panel showed HDL 37, LDL 111, and triglycerides 372.  We discussed lifestyle interventions.  We can recheck in 6 to 12 months.     Subjective:  HPI:  See Assessment / plan for status of chronic conditions.    He is here today discuss recent labs that he got through work. Notable for HDL 37, LDL 111, TG 372.  He has been try to work on diet and exercise for this.  Also noticed a rash on his penis.  Started a couple of days ago. He noticed symptoms a few hours after intercourse with his partner.  He is sexually active with same partner for the past several years.  Mildly irritated.  Nonpainful.  Located on left side of the base of his penis.  No treatments tried.        Objective:  Physical Exam: BP 113/77   Pulse (!) 101   Temp 99 F (37.2 C) (Temporal)   Ht 5' 9 (1.753 m)   Wt 210 lb 6.4 oz (95.4 kg)   SpO2 98%   BMI 31.07 kg/m   Gen: No acute distress, resting comfortably CV: Regular rate and rhythm with no murmurs appreciated Pulm: Normal work of breathing, clear to auscultation bilaterally with no crackles, wheezes, or rhonchi GU: Small approximately 2 to 3 mm erythematous lesion with on left base of penis.  No  vesicles.  No erythema.  No bleeding or drainage. Neuro: Grossly normal, moves all extremities Psych: Normal affect and thought content      Lakai Moree M. Kennyth, MD 03/04/2023 2:43 PM

## 2023-05-23 ENCOUNTER — Ambulatory Visit (INDEPENDENT_AMBULATORY_CARE_PROVIDER_SITE_OTHER): Payer: 59 | Admitting: Family Medicine

## 2023-05-23 ENCOUNTER — Encounter: Payer: Self-pay | Admitting: Family Medicine

## 2023-05-23 VITALS — BP 112/64 | HR 93 | Temp 98.2°F | Ht 69.0 in | Wt 204.0 lb

## 2023-05-23 DIAGNOSIS — Z0001 Encounter for general adult medical examination with abnormal findings: Secondary | ICD-10-CM

## 2023-05-23 DIAGNOSIS — Z13220 Encounter for screening for lipoid disorders: Secondary | ICD-10-CM | POA: Diagnosis not present

## 2023-05-23 DIAGNOSIS — Z131 Encounter for screening for diabetes mellitus: Secondary | ICD-10-CM

## 2023-05-23 DIAGNOSIS — E785 Hyperlipidemia, unspecified: Secondary | ICD-10-CM

## 2023-05-23 DIAGNOSIS — Z1159 Encounter for screening for other viral diseases: Secondary | ICD-10-CM

## 2023-05-23 LAB — CBC
HCT: 44.5 % (ref 39.0–52.0)
Hemoglobin: 15.9 g/dL (ref 13.0–17.0)
MCHC: 35.8 g/dL (ref 30.0–36.0)
MCV: 88.9 fl (ref 78.0–100.0)
Platelets: 259 10*3/uL (ref 150.0–400.0)
RBC: 5 Mil/uL (ref 4.22–5.81)
RDW: 13.2 % (ref 11.5–15.5)
WBC: 7.6 10*3/uL (ref 4.0–10.5)

## 2023-05-23 LAB — COMPREHENSIVE METABOLIC PANEL WITH GFR
ALT: 17 U/L (ref 0–53)
AST: 20 U/L (ref 0–37)
Albumin: 4.9 g/dL (ref 3.5–5.2)
Alkaline Phosphatase: 85 U/L (ref 39–117)
BUN: 13 mg/dL (ref 6–23)
CO2: 30 meq/L (ref 19–32)
Calcium: 9.7 mg/dL (ref 8.4–10.5)
Chloride: 102 meq/L (ref 96–112)
Creatinine, Ser: 1.05 mg/dL (ref 0.40–1.50)
GFR: 89.62 mL/min (ref >60.00)
Glucose, Bld: 82 mg/dL (ref 70–99)
Potassium: 3.7 meq/L (ref 3.5–5.1)
Sodium: 141 meq/L (ref 135–145)
Total Bilirubin: 0.5 mg/dL (ref 0.2–1.2)
Total Protein: 7.4 g/dL (ref 6.0–8.3)

## 2023-05-23 LAB — LDL CHOLESTEROL, DIRECT: Direct LDL: 95 mg/dL

## 2023-05-23 LAB — LIPID PANEL
Cholesterol: 180 mg/dL (ref 0–200)
HDL: 31.5 mg/dL — ABNORMAL LOW (ref >39.00)
NonHDL: 148.31
Total CHOL/HDL Ratio: 6
Triglycerides: 865 mg/dL — ABNORMAL HIGH (ref 0.0–149.0)
VLDL: 173 mg/dL — ABNORMAL HIGH (ref 0.0–40.0)

## 2023-05-23 LAB — TSH: TSH: 3.08 u[IU]/mL (ref 0.35–5.50)

## 2023-05-23 LAB — HEMOGLOBIN A1C: Hgb A1c MFr Bld: 4.8 % (ref 4.6–6.5)

## 2023-05-23 NOTE — Assessment & Plan Note (Signed)
 He has done great job with diet and exercise.  Will check labs today.

## 2023-05-23 NOTE — Progress Notes (Signed)
 Chief Complaint:  Nicholas Hammond is a 38 y.o. male who presents today for his annual comprehensive physical exam.    Assessment/Plan:  Chronic Problems Addressed Today: Dyslipidemia He has done great job with diet and exercise.  Will check labs today.  Preventative Healthcare: Check labs. UpToDate on vaccines.   Patient Counseling(The following topics were reviewed and/or handout was given):  -Nutrition: Stressed importance of moderation in sodium/caffeine intake, saturated fat and cholesterol, caloric balance, sufficient intake of fresh fruits, vegetables, and fiber.  -Stressed the importance of regular exercise.   -Substance Abuse: Discussed cessation/primary prevention of tobacco, alcohol, or other drug use; driving or other dangerous activities under the influence; availability of treatment for abuse.   -Injury prevention: Discussed safety belts, safety helmets, smoke detector, smoking near bedding or upholstery.   -Sexuality: Discussed sexually transmitted diseases, partner selection, use of condoms, avoidance of unintended pregnancy and contraceptive alternatives.   -Dental health: Discussed importance of regular tooth brushing, flossing, and dental visits.  -Health maintenance and immunizations reviewed. Please refer to Health maintenance section.  Return to care in 1 year for next preventative visit.     Subjective:  HPI:  He has no acute complaints today. See A/P for status of chronic conditions.  Lifestyle Diet: Cutting down on sweets and red meats. More fish.  Exercise: Exercising routinely      03/04/2023    1:52 PM  Depression screen PHQ 2/9  Decreased Interest 0  Down, Depressed, Hopeless 0  PHQ - 2 Score 0    There are no preventive care reminders to display for this patient.   ROS: Per HPI, otherwise a complete review of systems was negative.   PMH:  The following were reviewed and entered/updated in epic: Past Medical History:  Diagnosis Date    Allergy    History of chicken pox    Syncope and collapse    Patient Active Problem List   Diagnosis Date Noted   Dyslipidemia 03/04/2023   Anxiety 08/31/2020   Depression, major, single episode, mild (HCC) 08/31/2020   Nicotine dependence with current use 04/04/2017   Frequent stools 04/01/2017   ABSCESS, TRUNK 06/30/2007   DEGENERATIVE JOINT DISEASE, BACK 06/30/2007   History reviewed. No pertinent surgical history.  Family History  Problem Relation Age of Onset   Crohn's disease Mother    Arthritis Sister    Heart disease Sister        takayasu's arteritis   Rheum arthritis Sister    Crohn's disease Sister    Crohn's disease Brother    Cancer Maternal Grandmother    Heart disease Maternal Grandmother    Heart disease Maternal Grandfather    Heart disease Paternal Grandfather     Medications- reviewed and updated No current outpatient medications on file.   No current facility-administered medications for this visit.    Allergies-reviewed and updated Allergies  Allergen Reactions   Influenza Vaccines Other (See Comments)    Seizures    Social History   Socioeconomic History   Marital status: Single    Spouse name: Not on file   Number of children: Not on file   Years of education: Not on file   Highest education level: Bachelor's degree (e.g., BA, AB, BS)  Occupational History   Not on file  Tobacco Use   Smoking status: Former    Current packs/day: 1.00    Average packs/day: 1 pack/day for 15.0 years (15.0 ttl pk-yrs)    Types: Cigarettes   Smokeless  tobacco: Never  Vaping Use   Vaping status: Every Day  Substance and Sexual Activity   Alcohol use: Yes    Comment: socially   Drug use: Not Currently    Comment: 1/4 tsp everyday   Sexual activity: Yes  Other Topics Concern   Not on file  Social History Narrative   Lives with fiance   Left Handed   Drinks 4-6 cups caffeine daily   Social Drivers of Health   Financial Resource Strain: Low  Risk  (02/28/2023)   Overall Financial Resource Strain (CARDIA)    Difficulty of Paying Living Expenses: Not hard at all  Food Insecurity: No Food Insecurity (02/28/2023)   Hunger Vital Sign    Worried About Running Out of Food in the Last Year: Never true    Ran Out of Food in the Last Year: Never true  Transportation Needs: No Transportation Needs (02/28/2023)   PRAPARE - Administrator, Civil Service (Medical): No    Lack of Transportation (Non-Medical): No  Physical Activity: Sufficiently Active (02/28/2023)   Exercise Vital Sign    Days of Exercise per Week: 3 days    Minutes of Exercise per Session: 60 min  Stress: No Stress Concern Present (02/28/2023)   [employer] of Occupational Health - Occupational Stress Questionnaire    Feeling of Stress : Only a little  Social Connections: Moderately Isolated (02/28/2023)   Social Connection and Isolation Panel [NHANES]    Frequency of Communication with Friends and Family: Three times a week    Frequency of Social Gatherings with Friends and Family: Once a week    Attends Religious Services: Never    Database administrator or Organizations: No    Attends Engineer, structural: Not on file    Marital Status: Living with partner        Objective:  Physical Exam: BP 112/64   Pulse 93   Temp 98.2 F (36.8 C) (Temporal)   Ht 5\' 9"  (1.753 m)   Wt 204 lb (92.5 kg)   SpO2 96%   BMI 30.13 kg/m   Body mass index is 30.13 kg/m. Wt Readings from Last 3 Encounters:  05/23/23 204 lb (92.5 kg)  03/04/23 210 lb 6.4 oz (95.4 kg)  06/21/22 206 lb 6.4 oz (93.6 kg)   Gen: NAD, resting comfortably HEENT: TMs normal bilaterally. OP clear. No thyromegaly noted.  CV: RRR with no murmurs appreciated Pulm: NWOB, CTAB with no crackles, wheezes, or rhonchi GI: Normal bowel sounds present. Soft, Nontender, Nondistended. MSK: no edema, cyanosis, or clubbing noted Skin: warm, dry Neuro: CN2-12 grossly intact. Strength 5/5 in  upper and lower extremities. Reflexes symmetric and intact bilaterally.  Psych: Normal affect and thought content     Orion Mole M. Jimmey Ralph, MD 05/23/2023 2:09 PM

## 2023-05-23 NOTE — Patient Instructions (Signed)
 It was very nice to see you today!  You are doing a great job!  Keep up the very work.  Will check blood work today.  Will see back in a year for her next physical.  Come back sooner as needed.  Return in about 1 year (around 05/22/2024) for Annual Physical.   Take care, Dr Jimmey Ralph  PLEASE NOTE:  If you had any lab tests, please let us know if you have not heard back within a few days. You may see your results on mychart before we have a chance to review them but we will give you a call once they are reviewed by [place].   If we ordered any referrals today, please let us know if you have not heard from their office within the next week.   If you had any urgent prescriptions sent in today, please check with the pharmacy within an hour of our visit to make sure the prescription was transmitted appropriately.   Please try these tips to maintain a healthy lifestyle:  Eat at least 3 REAL meals and 1-2 snacks per day.  Aim for no more than 5 hours between eating.  If you eat breakfast, please do so within one hour of getting up.   Each meal should contain half fruits/vegetables, one quarter protein, and one quarter carbs (no bigger than a computer mouse)  Cut down on sweet beverages. This includes juice, soda, and sweet tea.   Drink at least 1 glass of water with each meal and aim for at least 8 glasses per day  Exercise at least 150 minutes every week.    Preventive Care 18-27 Years Old, Male Preventive care refers to lifestyle choices and visits with your health care provider that can promote health and wellness. Preventive care visits are also called wellness exams. What can I expect for my preventive care visit? Counseling During your preventive care visit, your health care provider may ask about your: Medical history, including: Past medical problems. Family medical history. Current health, including: Emotional well-being. Home life and relationship well-being. Sexual  activity. Lifestyle, including: Alcohol, nicotine or tobacco, and drug use. Access to firearms. Diet, exercise, and sleep habits. Safety issues such as seatbelt and bike helmet use. Sunscreen use. Work and work Astronomer. Physical exam Your health care provider may check your: Height and weight. These may be used to calculate your BMI (body mass index). BMI is a measurement that tells if you are at a healthy weight. Waist circumference. This measures the distance around your waistline. This measurement also tells if you are at a healthy weight and may help predict your risk of certain diseases, such as type 2 diabetes and high blood pressure. Heart rate and blood pressure. Body temperature. Skin for abnormal spots. What immunizations do I need?  Vaccines are usually given at various ages, according to a schedule. Your health care provider will recommend vaccines for you based on your age, medical history, and lifestyle or other factors, such as travel or where you work. What tests do I need? Screening Your health care provider may recommend screening tests for certain conditions. This may include: Lipid and cholesterol levels. Diabetes screening. This is done by checking your blood sugar (glucose) after you have not eaten for a while (fasting). Hepatitis B test. Hepatitis C test. HIV (human immunodeficiency virus) test. STI (sexually transmitted infection) testing, if you are at risk. Talk with your health care provider about your test results, treatment options, and if necessary, the  need for more tests. Follow these instructions at home: Eating and drinking  Eat a healthy diet that includes fresh fruits and vegetables, whole grains, lean protein, and low-fat dairy products. Drink enough fluid to keep your urine pale yellow. Take vitamin and mineral supplements as recommended by your health care provider. Do not drink alcohol if your health care provider tells you not to  drink. If you drink alcohol: Limit how much you have to 0-2 drinks a day. Know how much alcohol is in your drink. In the [place] one drink equals one 12 oz bottle of beer (355 mL), one 5 oz glass of wine (148 mL), or one 1 oz glass of hard liquor (44 mL). Lifestyle Brush your teeth every morning and night with fluoride toothpaste. Floss one time each day. Exercise for at least 30 minutes 5 or more days each week. Do not use any products that contain nicotine or tobacco. These products include cigarettes, chewing tobacco, and vaping devices, such as e-cigarettes. If you need help quitting, ask your health care provider. Do not use drugs. If you are sexually active, practice safe sex. Use a condom or other form of protection to prevent STIs. Find healthy ways to manage stress, such as: Meditation, yoga, or listening to music. Journaling. Talking to a trusted person. Spending time with friends and family. Minimize exposure to UV radiation to reduce your risk of skin cancer. Safety Always wear your seat belt while driving or riding in a vehicle. Do not drive: If you have been drinking alcohol. Do not ride with someone who has been drinking. If you have been using any mind-altering substances or drugs. While texting. When you are tired or distracted. Wear a helmet and other protective equipment during sports activities. If you have firearms in your house, make sure you follow all gun safety procedures. Seek help if you have been physically or sexually abused. What's next? Go to your health care provider once a year for an annual wellness visit. Ask your health care provider how often you should have your eyes and teeth checked. Stay up to date on all vaccines. This information is not intended to replace advice given to you by your health care provider. Make sure you discuss any questions you have with your health care provider. Document Revised: 08/02/2020 Document Reviewed:  08/02/2020 Elsevier Patient Education  2024 [employer].

## 2023-05-24 LAB — HEPATITIS C ANTIBODY: Hepatitis C Ab: NONREACTIVE

## 2023-05-26 ENCOUNTER — Encounter: Payer: Self-pay | Admitting: Family Medicine

## 2023-05-26 NOTE — Progress Notes (Signed)
 His triglycerides are elevated but the rest of his labs are at goal.  Can we clarify with patient if his labs were fasting?  If nonfasting he can come back here to recheck lipid panel fasting to get a more accurate assessment on his triglyceride.  We can recheck everything else in a year.

## 2023-05-27 ENCOUNTER — Encounter: Payer: Self-pay | Admitting: *Deleted

## 2023-05-28 ENCOUNTER — Other Ambulatory Visit: Payer: Self-pay | Admitting: *Deleted

## 2023-05-28 DIAGNOSIS — E785 Hyperlipidemia, unspecified: Secondary | ICD-10-CM

## 2023-06-16 ENCOUNTER — Ambulatory Visit: Admitting: Family Medicine

## 2023-06-16 ENCOUNTER — Other Ambulatory Visit (INDEPENDENT_AMBULATORY_CARE_PROVIDER_SITE_OTHER)

## 2023-06-16 DIAGNOSIS — E785 Hyperlipidemia, unspecified: Secondary | ICD-10-CM

## 2023-06-16 LAB — LIPID PANEL
Cholesterol: 198 mg/dL (ref 0–200)
HDL: 38.5 mg/dL — ABNORMAL LOW (ref >39.00)
LDL Cholesterol: 98 mg/dL (ref 0–99)
NonHDL: 159.88
Total CHOL/HDL Ratio: 5
Triglycerides: 307 mg/dL — ABNORMAL HIGH (ref 0.0–149.0)
VLDL: 61.4 mg/dL — ABNORMAL HIGH (ref 0.0–40.0)

## 2023-06-17 ENCOUNTER — Encounter: Payer: Self-pay | Admitting: Family Medicine

## 2023-06-17 NOTE — Progress Notes (Signed)
 Triglyceride are still elevated though not nearly as high as last time we checked.  Do not recommend starting any medications at this point however he should continue to work on diet and exercise and we can recheck again in a year or so.

## 2024-05-25 ENCOUNTER — Encounter: Admitting: Family Medicine
